# Patient Record
Sex: Female | Born: 1995 | Hispanic: No | Marital: Single | Smoking: Never smoker
Health system: Southern US, Community
[De-identification: ages and names within clinical notes are randomized; demographics above are authoritative.]

## PROBLEM LIST (undated history)

## (undated) DIAGNOSIS — F99 Mental disorder, not otherwise specified: Secondary | ICD-10-CM

## (undated) DIAGNOSIS — N61 Mastitis without abscess: Secondary | ICD-10-CM

## (undated) HISTORY — PX: NO PAST SURGERIES: SHX2092

## (undated) HISTORY — DX: Mastitis without abscess: N61.0

## (undated) HISTORY — DX: Mental disorder, not otherwise specified: F99

---
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
## 2017-04-03 ENCOUNTER — Encounter: Payer: Self-pay | Admitting: Family

## 2017-04-03 ENCOUNTER — Ambulatory Visit (INDEPENDENT_AMBULATORY_CARE_PROVIDER_SITE_OTHER): Payer: Managed Care, Other (non HMO) | Admitting: Family Medicine

## 2017-04-03 ENCOUNTER — Encounter: Payer: Self-pay | Admitting: Family Medicine

## 2017-04-03 VITALS — BP 110/70 | HR 92 | Temp 98.1°F | Ht 67.0 in | Wt 146.2 lb

## 2017-04-03 DIAGNOSIS — M545 Low back pain, unspecified: Secondary | ICD-10-CM

## 2017-04-03 MED ORDER — NAPROXEN 500 MG PO TABS: 500.0000 mg | ORAL_TABLET | Freq: Two times a day (BID) | ORAL | 0 refills | Status: DC

## 2017-04-03 MED ORDER — KETOROLAC TROMETHAMINE 60 MG/2ML IM SOLN
60.0000 mg | Freq: Once | INTRAMUSCULAR | Status: AC
  Administered 2017-04-03: 60 mg via INTRAMUSCULAR
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
## 2017-04-03 NOTE — Progress Notes (Signed)
Pre visit review using our clinic review tool, if applicable. No additional management support is needed unless otherwise documented below in the visit note. 

## 2017-04-03 NOTE — Patient Instructions (Signed)
Heat (pad or rice pillow in microwave) over affected area, 10-15 minutes twice daily.   OK to take Tylenol 1000 mg (2 extra strength tabs) or 975 mg (3 regular strength tabs) every 6 hours as needed.  EXERCISES  RANGE OF MOTION (ROM) AND STRETCHING EXERCISES - Low Back Prain Most people with lower back pain will find that their symptoms get worse with excessive bending forward (flexion) or arching at the lower back (extension). The exercises that will help resolve your symptoms will focus on the opposite motion.  If you have pain, numbness or tingling which travels down into your buttocks, leg or foot, the goal of the therapy is for these symptoms to move closer to your back and eventually resolve. Sometimes, these leg symptoms will get better, but your lower back pain may worsen. This is often an indication of progress in your rehabilitation. Be very alert to any changes in your symptoms and the activities in which you participated in the 24 hours prior to the change. Sharing this information with your caregiver will allow him or her to most efficiently treat your condition. These exercises may help you when beginning to rehabilitate your injury. Your symptoms may resolve with or without further involvement from your physician, physical therapist or athletic trainer. While completing these exercises, remember:   Restoring tissue flexibility helps normal motion to return to the joints. This allows healthier, less painful movement and activity.  An effective stretch should be held for at least 30 seconds.  A stretch should never be painful. You should only feel a gentle lengthening or release in the stretched tissue. FLEXION RANGE OF MOTION AND STRETCHING EXERCISES:  STRETCH - Flexion, Single Knee to Chest   Lie on a firm bed or floor with both legs extended in front of you.  Keeping one leg in contact with the floor, bring your opposite knee to your chest. Hold your leg in place by either  grabbing behind your thigh or at your knee.  Pull until you feel a gentle stretch in your low back. Hold 30 seconds.  Slowly release your grasp and repeat the exercise with the opposite side. Repeat 2 times. Complete this exercise 3 times per week.   STRETCH - Flexion, Double Knee to Chest  Lie on a firm bed or floor with both legs extended in front of you.  Keeping one leg in contact with the floor, bring your opposite knee to your chest.  Tense your stomach muscles to support your back and then lift your other knee to your chest. Hold your legs in place by either grabbing behind your thighs or at your knees.  Pull both knees toward your chest until you feel a gentle stretch in your low back. Hold 30 seconds.  Tense your stomach muscles and slowly return one leg at a time to the floor. Repeat 2 times. Complete this exercise 3 times per week.   STRETCH - Low Trunk Rotation  Lie on a firm bed or floor. Keeping your legs in front of you, bend your knees so they are both pointed toward the ceiling and your feet are flat on the floor.  Extend your arms out to the side. This will stabilize your upper body by keeping your shoulders in contact with the floor.  Gently and slowly drop both knees together to one side until you feel a gentle stretch in your low back. Hold for 30 seconds.  Tense your stomach muscles to support your lower back as you  bring your knees back to the starting position. Repeat the exercise to the other side. Repeat 2 times. Complete this exercise at least 3 times per week.   EXTENSION RANGE OF MOTION AND FLEXIBILITY EXERCISES:  STRETCH - Extension, Prone on Elbows   Lie on your stomach on the floor, a bed will be too soft. Place your palms about shoulder width apart and at the height of your head.  Place your elbows under your shoulders. If this is too painful, stack pillows under your chest.  Allow your body to relax so that your hips drop lower and make contact  more completely with the floor.  Hold this position for 30 seconds.  Slowly return to lying flat on the floor. Repeat 2 times. Complete this exercise 3 times per week.   RANGE OF MOTION - Extension, Prone Press Ups  Lie on your stomach on the floor, a bed will be too soft. Place your palms about shoulder width apart and at the height of your head.  Keeping your back as relaxed as possible, slowly straighten your elbows while keeping your hips on the floor. You may adjust the placement of your hands to maximize your comfort. As you gain motion, your hands will come more underneath your shoulders.  Hold this position 30 seconds.  Slowly return to lying flat on the floor. Repeat 2 times. Complete this exercise 3 times per week.   RANGE OF MOTION- Quadruped, Neutral Spine   Assume a hands and knees position on a firm surface. Keep your hands under your shoulders and your knees under your hips. You may place padding under your knees for comfort.  Drop your head and point your tailbone toward the ground below you. This will round out your lower back like an angry cat. Hold this position for 30 seconds.  Slowly lift your head and release your tail bone so that your back sags into a large arch, like an old horse.  Hold this position for 30 seconds.  Repeat this until you feel limber in your low back.  Now, find your "sweet spot." This will be the most comfortable position somewhere between the two previous positions. This is your neutral spine. Once you have found this position, tense your stomach muscles to support your low back.  Hold this position for 30 seconds. Repeat 2 times. Complete this exercise 3 times per week.   STRENGTHENING EXERCISES - Low Back Sprain These exercises may help you when beginning to rehabilitate your injury. These exercises should be done near your "sweet spot." This is the neutral, low-back arch, somewhere between fully rounded and fully arched, that is your  least painful position. When performed in this safe range of motion, these exercises can be used for people who have either a flexion or extension based injury. These exercises may resolve your symptoms with or without further involvement from your physician, physical therapist or athletic trainer. While completing these exercises, remember:   Muscles can gain both the endurance and the strength needed for everyday activities through controlled exercises.  Complete these exercises as instructed by your physician, physical therapist or athletic trainer. Increase the resistance and repetitions only as guided.  You may experience muscle soreness or fatigue, but the pain or discomfort you are trying to eliminate should never worsen during these exercises. If this pain does worsen, stop and make certain you are following the directions exactly. If the pain is still present after adjustments, discontinue the exercise until you can discuss  the trouble with your caregiver.  STRENGTHENING - Deep Abdominals, Pelvic Tilt   Lie on a firm bed or floor. Keeping your legs in front of you, bend your knees so they are both pointed toward the ceiling and your feet are flat on the floor.  Tense your lower abdominal muscles to press your low back into the floor. This motion will rotate your pelvis so that your tail bone is scooping upwards rather than pointing at your feet or into the floor. With a gentle tension and even breathing, hold this position for 3 seconds. Repeat 2 times. Complete this exercise 3 times per week.   STRENGTHENING - Abdominals, Crunches   Lie on a firm bed or floor. Keeping your legs in front of you, bend your knees so they are both pointed toward the ceiling and your feet are flat on the floor. Cross your arms over your chest.  Slightly tip your chin down without bending your neck.  Tense your abdominals and slowly lift your trunk high enough to just clear your shoulder blades. Lifting  higher can put excessive stress on the lower back and does not further strengthen your abdominal muscles.  Control your return to the starting position. Repeat 2 times. Complete this exercise 3 times per week.   STRENGTHENING - Quadruped, Opposite UE/LE Lift   Assume a hands and knees position on a firm surface. Keep your hands under your shoulders and your knees under your hips. You may place padding under your knees for comfort.  Find your neutral spine and gently tense your abdominal muscles so that you can maintain this position. Your shoulders and hips should form a rectangle that is parallel with the floor and is not twisted.  Keeping your trunk steady, lift your right hand no higher than your shoulder and then your left leg no higher than your hip. Make sure you are not holding your breath. Hold this position for 30 seconds.  Continuing to keep your abdominal muscles tense and your back steady, slowly return to your starting position. Repeat with the opposite arm and leg. Repeat 2 times. Complete this exercise 3 times per week.   STRENGTHENING - Abdominals and Quadriceps, Straight Leg Raise   Lie on a firm bed or floor with both legs extended in front of you.  Keeping one leg in contact with the floor, bend the other knee so that your foot can rest flat on the floor.  Find your neutral spine, and tense your abdominal muscles to maintain your spinal position throughout the exercise.  Slowly lift your straight leg off the floor about 6 inches for a count of 3, making sure to not hold your breath.  Still keeping your neutral spine, slowly lower your leg all the way to the floor. Repeat this exercise with each leg 2 times. Complete this exercise 3 times per week.  POSTURE AND BODY MECHANICS CONSIDERATIONS - Low Back Sprain Keeping correct posture when sitting, standing or completing your activities will reduce the stress put on different body tissues, allowing injured tissues a  chance to heal and limiting painful experiences. The following are general guidelines for improved posture.  While reading these guidelines, remember:  The exercises prescribed by your provider will help you have the flexibility and strength to maintain correct postures.  The correct posture provides the best environment for your joints to work. All of your joints have less wear and tear when properly supported by a spine with good posture. This means you   will experience a healthier, less painful body.  Correct posture must be practiced with all of your activities, especially prolonged sitting and standing. Correct posture is as important when doing repetitive low-stress activities (typing) as it is when doing a single heavy-load activity (lifting).  RESTING POSITIONS Consider which positions are most painful for you when choosing a resting position. If you have pain with flexion-based activities (sitting, bending, stooping, squatting), choose a position that allows you to rest in a less flexed posture. You would want to avoid curling into a fetal position on your side. If your pain worsens with extension-based activities (prolonged standing, working overhead), avoid resting in an extended position such as sleeping on your stomach. Most people will find more comfort when they rest with their spine in a more neutral position, neither too rounded nor too arched. Lying on a non-sagging bed on your side with a pillow between your knees, or on your back with a pillow under your knees will often provide some relief. Keep in mind, being in any one position for a prolonged period of time, no matter how correct your posture, can still lead to stiffness.  PROPER SITTING POSTURE In order to minimize stress and discomfort on your spine, you must sit with correct posture. Sitting with good posture should be effortless for a healthy body. Returning to good posture is a gradual process. Many people can work toward this  most comfortably by using various supports until they have the flexibility and strength to maintain this posture on their own. When sitting with proper posture, your ears will fall over your shoulders and your shoulders will fall over your hips. You should use the back of the chair to support your upper back. Your lower back will be in a neutral position, just slightly arched. You may place a small pillow or folded towel at the base of your lower back for  support.  When working at a desk, create an environment that supports good, upright posture. Without extra support, muscles tire, which leads to excessive strain on joints and other tissues. Keep these recommendations in mind:  CHAIR:  A chair should be able to slide under your desk when your back makes contact with the back of the chair. This allows you to work closely.  The chair's height should allow your eyes to be level with the upper part of your monitor and your hands to be slightly lower than your elbows.  BODY POSITION  Your feet should make contact with the floor. If this is not possible, use a foot rest.  Keep your ears over your shoulders. This will reduce stress on your neck and low back.  INCORRECT SITTING POSTURES  If you are feeling tired and unable to assume a healthy sitting posture, do not slouch or slump. This puts excessive strain on your back tissues, causing more damage and pain. Healthier options include:  Using more support, like a lumbar pillow.  Switching tasks to something that requires you to be upright or walking.  Talking a brief walk.  Lying down to rest in a neutral-spine position.  PROLONGED STANDING WHILE SLIGHTLY LEANING FORWARD  When completing a task that requires you to lean forward while standing in one place for a long time, place either foot up on a stationary 2-4 inch high object to help maintain the best posture. When both feet are on the ground, the lower back tends to lose its slight inward  curve. If this curve flattens (or becomes too   large), then the back and your other joints will experience too much stress, tire more quickly, and can cause pain.  CORRECT STANDING POSTURES Proper standing posture should be assumed with all daily activities, even if they only take a few moments, like when brushing your teeth. As in sitting, your ears should fall over your shoulders and your shoulders should fall over your hips. You should keep a slight tension in your abdominal muscles to brace your spine. Your tailbone should point down to the ground, not behind your body, resulting in an over-extended swayback posture.   INCORRECT STANDING POSTURES  Common incorrect standing postures include a forward head, locked knees and/or an excessive swayback. WALKING Walk with an upright posture. Your ears, shoulders and hips should all line-up.  PROLONGED ACTIVITY IN A FLEXED POSITION When completing a task that requires you to bend forward at your waist or lean over a low surface, try to find a way to stabilize 3 out of 4 of your limbs. You can place a hand or elbow on your thigh or rest a knee on the surface you are reaching across. This will provide you more stability, so that your muscles do not tire as quickly. By keeping your knees relaxed, or slightly bent, you will also reduce stress across your lower back. CORRECT LIFTING TECHNIQUES  DO :  Assume a wide stance. This will provide you more stability and the opportunity to get as close as possible to the object which you are lifting.  Tense your abdominals to brace your spine. Bend at the knees and hips. Keeping your back locked in a neutral-spine position, lift using your leg muscles. Lift with your legs, keeping your back straight.  Test the weight of unknown objects before attempting to lift them.  Try to keep your elbows locked down at your sides in order get the best strength from your shoulders when carrying an object.     Always ask  for help when lifting heavy or awkward objects. INCORRECT LIFTING TECHNIQUES DO NOT:   Lock your knees when lifting, even if it is a small object.  Bend and twist. Pivot at your feet or move your feet when needing to change directions.  Assume that you can safely pick up even a paperclip without proper posture.   

## 2017-07-25 ENCOUNTER — Other Ambulatory Visit: Payer: Self-pay | Admitting: Family

## 2017-07-27 NOTE — Telephone Encounter (Signed)
Melissa-- please advise blisovi refill request?  Pt seen for acute visit by Dr Carmelia RollerWendling in March but hasn't seen you since 07/24/16.

## 2017-07-28 ENCOUNTER — Telehealth: Payer: Self-pay | Admitting: Family

## 2017-07-28 NOTE — Telephone Encounter (Signed)
Katie Butler -- please call pt to schedule appt. Thanks!

## 2017-07-28 NOTE — Telephone Encounter (Signed)
Called pt and LVM informing pt that her refill was sent. However, she is due for a CPE. Advised for pt to call back and schedule a cpe at her convenience.

## 2017-07-28 NOTE — Telephone Encounter (Signed)
Refill sent, needs OV for CPX please.

## 2017-08-19 ENCOUNTER — Telehealth: Payer: Self-pay | Admitting: *Deleted

## 2017-08-19 NOTE — Telephone Encounter (Signed)
Received request for Medical Records from [employer]; forwarded to [place] for email/scan/SLS 07/17

## 2018-02-19 ENCOUNTER — Ambulatory Visit: Payer: 59 | Admitting: Psychology

## 2018-03-02 ENCOUNTER — Ambulatory Visit (INDEPENDENT_AMBULATORY_CARE_PROVIDER_SITE_OTHER): Payer: 59 | Admitting: Psychology

## 2018-03-02 DIAGNOSIS — F411 Generalized anxiety disorder: Secondary | ICD-10-CM | POA: Diagnosis not present

## 2018-03-02 DIAGNOSIS — F4310 Post-traumatic stress disorder, unspecified: Secondary | ICD-10-CM | POA: Diagnosis not present

## 2018-03-03 ENCOUNTER — Encounter: Payer: Self-pay | Admitting: Family

## 2018-03-03 ENCOUNTER — Ambulatory Visit (INDEPENDENT_AMBULATORY_CARE_PROVIDER_SITE_OTHER): Payer: Managed Care, Other (non HMO) | Admitting: Family

## 2018-03-03 VITALS — BP 135/78 | HR 104 | Temp 98.2°F | Resp 16 | Ht 67.0 in | Wt 127.0 lb

## 2018-03-03 DIAGNOSIS — Z30011 Encounter for initial prescription of contraceptive pills: Secondary | ICD-10-CM | POA: Diagnosis not present

## 2018-03-03 DIAGNOSIS — R634 Abnormal weight loss: Secondary | ICD-10-CM | POA: Diagnosis not present

## 2018-03-03 DIAGNOSIS — F418 Other specified anxiety disorders: Secondary | ICD-10-CM

## 2018-03-03 LAB — TSH: TSH: 2.46 u[IU]/mL (ref 0.35–5.50)

## 2018-03-03 MED ORDER — SERTRALINE HCL 50 MG PO TABS: ORAL_TABLET | ORAL | 0 refills | Status: DC

## 2018-03-03 NOTE — Patient Instructions (Signed)
Please complete lab work prior to leaving. Start zoloft 1/2 tab once daily for 1 week, then increase to a full tab once daily on week two.
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
## 2018-03-16 ENCOUNTER — Ambulatory Visit: Payer: 59 | Admitting: Psychology

## 2018-03-29 ENCOUNTER — Other Ambulatory Visit: Payer: Self-pay | Admitting: Family

## 2018-03-30 ENCOUNTER — Ambulatory Visit (INDEPENDENT_AMBULATORY_CARE_PROVIDER_SITE_OTHER): Payer: Managed Care, Other (non HMO) | Admitting: Family

## 2018-03-30 ENCOUNTER — Ambulatory Visit: Payer: Self-pay | Admitting: Family

## 2018-03-30 ENCOUNTER — Encounter: Payer: Self-pay | Admitting: Family

## 2018-03-30 ENCOUNTER — Ambulatory Visit (INDEPENDENT_AMBULATORY_CARE_PROVIDER_SITE_OTHER): Payer: Managed Care, Other (non HMO) | Admitting: Psychology

## 2018-03-30 VITALS — BP 116/71 | HR 65 | Temp 98.6°F | Resp 16 | Ht 67.0 in | Wt 132.6 lb

## 2018-03-30 DIAGNOSIS — F411 Generalized anxiety disorder: Secondary | ICD-10-CM | POA: Diagnosis not present

## 2018-03-30 DIAGNOSIS — F418 Other specified anxiety disorders: Secondary | ICD-10-CM | POA: Diagnosis not present

## 2018-03-30 NOTE — Patient Instructions (Signed)
Please continue zoloft and your appointments with Terrie.

## 2018-03-30 NOTE — Progress Notes (Signed)
Subjective:    Patient ID: Katie Butler, female    DOB: 05/19/1995, 23 y.o.   MRN: [id number]  HPI  Patient is a 23 yr old female who presents today for follow up of her depression/anxiety. Last visit she noted that she was sleeping a lot, having anxiety and racing thoughts.  She reported poor appetite, weight loss.  Inability to tolerate unexpected changes. Also reported that she had trouble turning off her brain.   Wt Readings from Last 3 Encounters:  03/30/18 132 lb 9.6 oz (60.1 kg)  03/03/18 127 lb (57.6 kg)  04/03/17 146 lb 4 oz (66.3 kg) (78 %, Z= 0.77)*   * Growth percentiles are based on CDC (Girls, 2-20 Years) data.   She reports that since starting the medication she feels less anxious. Still thinks a lot but manage her thoughts better.  She reports that she had one episode which was 2 or 3 days after she started the half dose of Zoloft when she got really upset and began hitting herself.  Her mom calmed her down.  She seen realized that she did not want to hurt her self and that she needed to better manage her emotions.  Overall she is feeling a lot better.  Review of Systems    see HPI  No past medical history on file.   Social History   Socioeconomic History  . Marital status: Single    Spouse name: Not on file  . Number of children: Not on file  . Years of education: Not on file  . Highest education level: Not on file  Occupational History  . Not on file  Social Needs  . Financial resource strain: Not on file  . Food insecurity:    Worry: Not on file    Inability: Not on file  . Transportation needs:    Medical: Not on file    Non-medical: Not on file  Tobacco Use  . Smoking status: Never Smoker  . Smokeless tobacco: Never Used  Substance and Sexual Activity  . Alcohol use: No    Alcohol/week: 0.0 standard drinks  . Drug use: Not on file  . Sexual activity: Yes    Partners: Female  Lifestyle  . Physical activity:    Days per week: Not on file   Minutes per session: Not on file  . Stress: Not on file  Relationships  . Social connections:    Talks on phone: Not on file    Gets together: Not on file    Attends religious service: Not on file    Active member of club or organization: Not on file    Attends meetings of clubs or organizations: Not on file    Relationship status: Not on file  . Intimate partner violence:    Fear of current or ex partner: Not on file    Emotionally abused: Not on file    Physically abused: Not on file    Forced sexual activity: Not on file  Other Topics Concern  . Not on file  Social History Narrative  . Not on file    Past Surgical History:  Procedure Laterality Date  . NO PAST SURGERIES      Family History  Problem Relation Age of Onset  . Cancer Other        breast  . Arthritis Maternal Grandmother   . Fibromyalgia Maternal Grandmother   . Scoliosis Maternal Grandmother   . Arthritis Maternal Grandfather   . Hypertension  Maternal Grandfather   . Cancer Maternal Grandfather        lung, prostate  . Hyperlipidemia Maternal Grandfather   . Arthritis Paternal Grandmother   . Diabetes Paternal Grandmother   . Arthritis Paternal Grandfather   . Other Father        hypoglycemia    No Known Allergies  Current Outpatient Medications on File Prior to Visit  Medication Sig Dispense Refill  . Norethin Ace-Eth Estrad-FE (BLISOVI 24 FE PO) Take by mouth.    . sertraline (ZOLOFT) 50 MG tablet 1/2 tab by mouth once daily for 1 week, then increase to a full tab once daily. 30 tablet 0   No current facility-administered medications on file prior to visit.     BP 116/71 (BP Location: Left Arm, Cuff Size: Normal)   Pulse 65   Temp 98.6 F (37 C) (Oral)   Resp 16   Ht 5\' 7"  (1.702 m)   Wt 132 lb 9.6 oz (60.1 kg)   SpO2 100%   BMI 20.77 kg/m    Objective:   Physical Exam Constitutional:      Appearance: Normal appearance.  Neurological:     General: No focal deficit present.      Mental Status: She is alert and oriented to person, place, and time.  Psychiatric:        Mood and Affect: Mood normal.        Behavior: Behavior normal.        Thought Content: Thought content normal.        Judgment: Judgment normal.           Assessment & Plan:   Depression/anxiety-this is improved.  She has an appointment later today to meet with the counselor.  I think that this is very important for her to try to work through some of the trauma and other issues that have been difficult for her.  She has gained a few pounds which I think is good.  We discussed that she should try to keep her weight under 140 and if she starts to see her weight rising >140 she should let me know.  A total of *  minutes were spent face-to-face with the patient during this encounter and over half of that time was spent on counseling and coordination of care. The patient was counseled on anxiety/depression.

## 2018-04-14 ENCOUNTER — Ambulatory Visit (INDEPENDENT_AMBULATORY_CARE_PROVIDER_SITE_OTHER): Payer: Managed Care, Other (non HMO) | Admitting: Obstetrics and Gynecology

## 2018-04-14 ENCOUNTER — Encounter: Payer: Self-pay | Admitting: Obstetrics and Gynecology

## 2018-04-14 ENCOUNTER — Other Ambulatory Visit: Payer: Self-pay

## 2018-04-14 VITALS — BP 117/69 | HR 72 | Ht 67.0 in | Wt 132.0 lb

## 2018-04-14 DIAGNOSIS — Z3009 Encounter for other general counseling and advice on contraception: Secondary | ICD-10-CM

## 2018-04-14 DIAGNOSIS — Z01419 Encounter for gynecological examination (general) (routine) without abnormal findings: Secondary | ICD-10-CM

## 2018-04-14 DIAGNOSIS — N939 Abnormal uterine and vaginal bleeding, unspecified: Secondary | ICD-10-CM

## 2018-04-14 DIAGNOSIS — Z113 Encounter for screening for infections with a predominantly sexual mode of transmission: Secondary | ICD-10-CM | POA: Diagnosis not present

## 2018-04-14 DIAGNOSIS — Z23 Encounter for immunization: Secondary | ICD-10-CM

## 2018-04-14 NOTE — Progress Notes (Signed)
Pt states that she has been having irregular periods x 4 months.

## 2018-04-14 NOTE — Progress Notes (Signed)
GYNECOLOGY ANNUAL PREVENTATIVE CARE ENCOUNTER NOTE  Subjective:   Katie Butler is a 23 y.o. G0P0000 female here for a annual gynecologic exam. Current complaints: irregular periods. Denies discharge, pelvic pain, problems with intercourse or other gynecologic concerns.   Has been on OCPs since 2018. Was having regular periods with it. Then 4-6 months ago, started having very irregular periods. Periods are 2-11 days, occurring every 2-4 weeks. They are very random and not predictable at all. She is not missing pills or doubling up. Nothing else has changed. Started meds for anxiety about 4 weeks ago. Periods are lighter than before she started the OCPs. Started OCPs for contraception.   Desires STI screen.    Gynecologic History Patient's last menstrual period was 03/26/2018. Contraception: OCP (estrogen/progesterone), sexually active with women Last Pap: n/a Last mammogram: n/a Gardisil: has not received  Obstetric History OB History  Gravida Para Term Preterm AB Living  0 0 0 0 0 0  SAB TAB Ectopic Multiple Live Births  0 0 0 0 0   History reviewed. No pertinent past medical history.  Past Surgical History:  Procedure Laterality Date  . NO PAST SURGERIES      Current Outpatient Medications on File Prior to Visit  Medication Sig Dispense Refill  . Norethin Ace-Eth Estrad-FE (BLISOVI 24 FE PO) Take by mouth.    . sertraline (ZOLOFT) 50 MG tablet Take 1 tablet (50 mg total) by mouth daily. 90 tablet 0   No current facility-administered medications on file prior to visit.     No Known Allergies  Social History   Socioeconomic History  . Marital status: Single    Spouse name: Not on file  . Number of children: Not on file  . Years of education: Not on file  . Highest education level: Not on file  Occupational History  . Not on file  Social Needs  . Financial resource strain: Not on file  . Food insecurity:    Worry: Not on file    Inability: Not on file  .  Transportation needs:    Medical: Not on file    Non-medical: Not on file  Tobacco Use  . Smoking status: Never Smoker  Substance and Sexual Activity  . Alcohol use: No    Alcohol/week: 0.0 standard drinks  . Drug use: Never  . Sexual activity: Not Currently    Partners: Female    Birth control/protection: Pill  Lifestyle  . Physical activity:    Days per week: Not on file    Minutes per session: Not on file  . Stress: Not on file  Relationships  . Social connections:    Talks on phone: Not on file    Gets together: Not on file    Attends religious service: Not on file    Active member of club or organization: Not on file    Attends meetings of clubs or organizations: Not on file    Relationship status: Not on file  . Intimate partner violence:    Fear of current or ex partner: Not on file    Emotionally abused: Not on file    Physically abused: Not on file    Forced sexual activity: Not on file  Other Topics Concern  . Not on file  Social History Narrative  . Not on file    Family History  Problem Relation Age of Onset  . Cancer Other        breast  . Arthritis Maternal  Grandmother   . Fibromyalgia Maternal Grandmother   . Scoliosis Maternal Grandmother   . Arthritis Maternal Grandfather   . Hypertension Maternal Grandfather   . Cancer Maternal Grandfather        lung, prostate  . Hyperlipidemia Maternal Grandfather   . Arthritis Paternal Grandmother   . Diabetes Paternal Grandmother   . Arthritis Paternal Grandfather   . Other Father        hypoglycemia    The following portions of the patient's history were reviewed and updated as appropriate: allergies, current medications, past family history, past medical history, past social history, past surgical history and problem list.  Review of Systems Pertinent items are noted in HPI.   Objective:  BP 117/69   Pulse 72   Ht 5\' 7"  (1.702 m)   Wt 132 lb (59.9 kg)   LMP 03/26/2018   BMI 20.67 kg/m   CONSTITUTIONAL: Well-developed, well-nourished female in no acute distress.  HENT:  Normocephalic, atraumatic, External right and left ear normal. Oropharynx is clear and moist EYES: Conjunctivae and EOM are normal. Pupils are equal, round, and reactive to light. No scleral icterus.  NECK: Normal range of motion, supple, no masses.  Normal thyroid.  SKIN: Skin is warm and dry. No rash noted. Not diaphoretic. No erythema. No pallor. NEUROLOGIC: Alert and oriented to person, place, and time. Normal reflexes, muscle tone coordination. No cranial nerve deficit noted. PSYCHIATRIC: Normal mood and affect. Normal behavior. Normal judgment and thought content. CARDIOVASCULAR: Normal heart rate noted, regular rhythm RESPIRATORY: Clear to auscultation bilaterally. Effort and breath sounds normal, no problems with respiration noted. BREASTS: Symmetric in size. No masses, skin changes, nipple drainage, or lymphadenopathy. ABDOMEN: Soft, normal bowel sounds, no distention noted.  No tenderness, rebound or guarding.  PELVIC: Normal appearing external genitalia; normal appearing vaginal mucosa and cervix.  No abnormal discharge noted. Normal uterine size, no other palpable masses, no uterine or adnexal tenderness. MUSCULOSKELETAL: Normal range of motion. No tenderness.  No cyanosis, clubbing, or edema.  2+ distal pulses.   Assessment and Plan:   1. Well woman exam Benign exam  2. Encounter for counseling regarding contraception States does not need contraception for now as she is not having intercourse with men, but would like to be on something so that if she decides to have intercourse with men, will not become pregnant  3. Routine screening for STI (sexually transmitted infection) - HIV Antibody (routine testing w rflx) - Hepatitis B surface antigen - Hepatitis C Antibody - RPR - Cervicovaginal ancillary only( [place])  4. Need for HPV vaccination - HPV 9-valent vaccine,Recombinat  5.  Abnormal uterine bleeding (AUB) - For last 4-6 months on OCPs - reviewed that if she does not need OCPs for contraception as she is currently only sexually active with women, she has several options including to stop OCPs, switch OCPs, switch to another method of contraception. Reviewed risks/benefits of each, including of LARCs. She is interested in IUD but concerned about pain with insertion, I reviewed expected course. She desires to stop OCPs, will have further discussion pending how she does at next visit as she is coming in two months for gardisil shot. Patient in agreement with plan.   Will follow up results of STI screen and manage accordingly. Encouraged improvement in diet and exercise.  Mammogram n/a Referral for colonoscopy n/a Gardisil TODAY!!  Routine preventative health maintenance measures emphasized. Please refer to After Visit Summary for other counseling recommendations.    K. Meryl  Earlene Plater, M.D. [facility]ending [facility])

## 2018-04-15 LAB — HEPATITIS B SURFACE ANTIGEN: HEP B S AG: NEGATIVE

## 2018-04-15 LAB — HEPATITIS C ANTIBODY: Hep C Virus Ab: <0.1 s/co ratio (ref 0.0–0.9)

## 2018-04-15 LAB — HIV ANTIBODY (ROUTINE TESTING W REFLEX): HIV SCREEN 4TH GENERATION: NONREACTIVE

## 2018-04-15 LAB — RPR: RPR Ser Ql: NONREACTIVE

## 2018-04-16 LAB — CERVICOVAGINAL ANCILLARY ONLY
Chlamydia: NEGATIVE
Neisseria Gonorrhea: NEGATIVE
Trichomonas: NEGATIVE

## 2018-04-27 ENCOUNTER — Ambulatory Visit: Payer: 59 | Admitting: Psychology

## 2018-04-28 ENCOUNTER — Ambulatory Visit (INDEPENDENT_AMBULATORY_CARE_PROVIDER_SITE_OTHER): Payer: 59 | Admitting: Psychology

## 2018-04-28 DIAGNOSIS — F411 Generalized anxiety disorder: Secondary | ICD-10-CM

## 2018-05-11 ENCOUNTER — Ambulatory Visit (INDEPENDENT_AMBULATORY_CARE_PROVIDER_SITE_OTHER): Payer: 59 | Admitting: Psychology

## 2018-05-11 DIAGNOSIS — F411 Generalized anxiety disorder: Secondary | ICD-10-CM | POA: Diagnosis not present

## 2018-05-25 ENCOUNTER — Ambulatory Visit (INDEPENDENT_AMBULATORY_CARE_PROVIDER_SITE_OTHER): Payer: 59 | Admitting: Psychology

## 2018-05-25 DIAGNOSIS — F411 Generalized anxiety disorder: Secondary | ICD-10-CM

## 2018-06-09 ENCOUNTER — Ambulatory Visit (INDEPENDENT_AMBULATORY_CARE_PROVIDER_SITE_OTHER): Payer: 59 | Admitting: Psychology

## 2018-06-09 DIAGNOSIS — F411 Generalized anxiety disorder: Secondary | ICD-10-CM

## 2018-06-14 ENCOUNTER — Ambulatory Visit: Payer: Self-pay | Admitting: Obstetrics & Gynecology

## 2018-06-23 ENCOUNTER — Ambulatory Visit (INDEPENDENT_AMBULATORY_CARE_PROVIDER_SITE_OTHER): Payer: 59 | Admitting: Psychology

## 2018-06-23 DIAGNOSIS — F411 Generalized anxiety disorder: Secondary | ICD-10-CM | POA: Diagnosis not present

## 2018-06-26 ENCOUNTER — Other Ambulatory Visit: Payer: Self-pay | Admitting: Family

## 2018-06-29 ENCOUNTER — Other Ambulatory Visit: Payer: Self-pay

## 2018-06-29 ENCOUNTER — Telehealth (INDEPENDENT_AMBULATORY_CARE_PROVIDER_SITE_OTHER): Payer: Managed Care, Other (non HMO) | Admitting: Family

## 2018-06-29 DIAGNOSIS — F418 Other specified anxiety disorders: Secondary | ICD-10-CM

## 2018-06-29 DIAGNOSIS — N926 Irregular menstruation, unspecified: Secondary | ICD-10-CM | POA: Diagnosis not present

## 2018-06-29 NOTE — Progress Notes (Signed)
Virtual Visit via Video Note  I connected with@ on 06/29/18 at  9:40 AM EDT by a video enabled telemedicine application and verified that I am speaking with the correct person using two identifiers. This visit type was conducted due to national recommendations for restrictions regarding the COVID-19 Pandemic (e.g. social distancing).  This format is felt to be most appropriate for this patient at this time.   I discussed the limitations of evaluation and management by telemedicine and the availability of in person appointments. The patient expressed understanding and agreed to proceed.  Only the patient and myself were on today's video visit. The patient was at home and I was in my office at the time of today's visit.   History of Present Illness:  Patient is a 23 yr old female who presents today for follow up of her depression and anxiety. Reports that she is seeing Engineer, structural (counselor) every 2 weeks.  Reports that she is sleeping well.  She reports that she has not checked her weight recently. Feels that her appetite has improved.  She denies any panic attacks. She continues to work at [employer] and [employer].   She does report some irregular menses while on OCP. She reports "sometimes period would be 3 days and sometimes 11 days." Reports that she has been off of OCP for several months and has an appointment tomorrow to meet with GYN to further discuss.   Observations/Objective:   Gen: Awake, alert, no acute distress Resp: Breathing is even and non-labored Psych: calm/pleasant demeanor Neuro: Alert and Oriented x 3, + facial symmetry, speech is clear.  Assessment and Plan:  Depression/anxiety- stable/improved with counseling and medicine.  Encouraged pt to continue same.   Irregular menses- new. Agree with GYN follow up to discuss. She is not needing OCP for contraception at this time.    Follow Up Instructions:    I discussed the assessment and treatment plan with the  patient. The patient was provided an opportunity to ask questions and all were answered. The patient agreed with the plan and demonstrated an understanding of the instructions.   The patient was advised to call back or seek an in-person evaluation if the symptoms worsen or if the condition fails to improve as anticipated.    Lemont Fillers, NP

## 2018-06-30 ENCOUNTER — Other Ambulatory Visit: Payer: Self-pay

## 2018-06-30 ENCOUNTER — Ambulatory Visit (INDEPENDENT_AMBULATORY_CARE_PROVIDER_SITE_OTHER): Payer: Managed Care, Other (non HMO) | Admitting: Obstetrics and Gynecology

## 2018-06-30 ENCOUNTER — Encounter: Payer: Self-pay | Admitting: Obstetrics and Gynecology

## 2018-06-30 VITALS — BP 113/64 | HR 77 | Resp 16 | Ht 67.0 in | Wt 130.0 lb

## 2018-06-30 DIAGNOSIS — Z23 Encounter for immunization: Secondary | ICD-10-CM | POA: Diagnosis not present

## 2018-06-30 DIAGNOSIS — Z3009 Encounter for other general counseling and advice on contraception: Secondary | ICD-10-CM

## 2018-06-30 MED ORDER — LO LOESTRIN FE 1 MG-10 MCG / 10 MCG PO TABS: 1.0000 | ORAL_TABLET | Freq: Every day | ORAL | 1 refills | Status: DC

## 2018-06-30 NOTE — Patient Instructions (Signed)
Use the following websites (and others) to help learn more about your contraception options and find the method that is right for you!  - The Centers for Disease Control (CDC) website: TransferLive.sehttps://www.cdc.gov/reproductivehealth/contraception/index.htm  - [facility] website: https://www.plannedparenthood.org/learn/birth-control  - Bedsider.org: https://www.bedsider.org/methods     Levonorgestrel intrauterine device (IUD) What is this medicine? LEVONORGESTREL IUD (LEE voe nor jes trel) is a contraceptive (birth control) device. The device is placed inside the uterus by a healthcare professional. It is used to prevent pregnancy. This device can also be used to treat heavy bleeding that occurs during your period. This medicine may be used for other purposes; ask your health care provider or pharmacist if you have questions. COMMON BRAND NAME(S): Cameron AliKyleena, LILETTA, Mirena, Skyla What should I tell my health care provider before I take this medicine? They need to know if you have any of these conditions: -abnormal Pap smear -cancer of the breast, uterus, or cervix -diabetes -endometritis -genital or pelvic infection now or in the past -have more than one sexual partner or your partner has more than one partner -heart disease -history of an ectopic or tubal pregnancy -immune system problems -IUD in place -liver disease or tumor -problems with blood clots or take blood-thinners -seizures -use intravenous drugs -uterus of unusual shape -vaginal bleeding that has not been explained -an unusual or allergic reaction to levonorgestrel, other hormones, silicone, or polyethylene, medicines, foods, dyes, or preservatives -pregnant or trying to get pregnant -breast-feeding How should I use this medicine? This device is placed inside the uterus by a health care professional. Talk to your pediatrician regarding the use of this medicine in children. Special care may be needed. Overdosage: If  you think you have taken too much of this medicine contact a poison control center or emergency room at once. NOTE: This medicine is only for you. Do not share this medicine with others. What if I miss a dose? This does not apply. Depending on the brand of device you have inserted, the device will need to be replaced every 3 to 5 years if you wish to continue using this type of birth control. What may interact with this medicine? Do not take this medicine with any of the following medications: -amprenavir -bosentan -fosamprenavir This medicine may also interact with the following medications: -aprepitant -armodafinil -barbiturate medicines for inducing sleep or treating seizures -bexarotene -boceprevir -griseofulvin -medicines to treat seizures like carbamazepine, ethotoin, felbamate, oxcarbazepine, phenytoin, topiramate -modafinil -pioglitazone -rifabutin -rifampin -rifapentine -some medicines to treat HIV infection like atazanavir, efavirenz, indinavir, lopinavir, nelfinavir, tipranavir, ritonavir -St. John's wort -warfarin This list may not describe all possible interactions. Give your health care provider a list of all the medicines, herbs, non-prescription drugs, or dietary supplements you use. Also tell them if you smoke, drink alcohol, or use illegal drugs. Some items may interact with your medicine. What should I watch for while using this medicine? Visit your doctor or health care professional for regular check ups. See your doctor if you or your partner has sexual contact with others, becomes HIV positive, or gets a sexual transmitted disease. This product does not protect you against HIV infection (AIDS) or other sexually transmitted diseases. You can check the placement of the IUD yourself by reaching up to the top of your vagina with clean fingers to feel the threads. Do not pull on the threads. It is a good habit to check placement after each menstrual period. Call your  doctor right away if you feel more of the IUD than  just the threads or if you cannot feel the threads at all. The IUD may come out by itself. You may become pregnant if the device comes out. If you notice that the IUD has come out use a backup birth control method like condoms and call your health care provider. Using tampons will not change the position of the IUD and are okay to use during your period. This IUD can be safely scanned with magnetic resonance imaging (MRI) only under specific conditions. Before you have an MRI, tell your healthcare provider that you have an IUD in place, and which type of IUD you have in place. What side effects may I notice from receiving this medicine? Side effects that you should report to your doctor or health care professional as soon as possible: -allergic reactions like skin rash, itching or hives, swelling of the face, lips, or tongue -fever, flu-like symptoms -genital sores -high blood pressure -no menstrual period for 6 weeks during use -pain, swelling, warmth in the leg -pelvic pain or tenderness -severe or sudden headache -signs of pregnancy -stomach cramping -sudden shortness of breath -trouble with balance, talking, or walking -unusual vaginal bleeding, discharge -yellowing of the eyes or skin Side effects that usually do not require medical attention (report to your doctor or health care professional if they continue or are bothersome): -acne -breast pain -change in sex drive or performance -changes in weight -cramping, dizziness, or faintness while the device is being inserted -headache -irregular menstrual bleeding within first 3 to 6 months of use -nausea This list may not describe all possible side effects. Call your doctor for medical advice about side effects. You may report side effects to FDA at 1-800-FDA-1088. Where should I keep my medicine? This does not apply. NOTE: This sheet is a summary. It may not cover all possible  information. If you have questions about this medicine, talk to your doctor, pharmacist, or health care provider.  2019 Elsevier/Gold Standard (2015-11-02 14:14:56)

## 2018-06-30 NOTE — Progress Notes (Signed)
   GYNECOLOGY OFFICE FOLLOW UP NOTE  History:  23 y.o. G0P0000 here today for follow up for AUB. Was on OCPs and had very heavy, painful periods,  had abnormal bleeding. Last visit, after reviewing options, pt opted to stop OCPs altogether and see how her periods went. Last period was okay, bled 5-6 days and was not heavy. Period before that, was extremely heavy.  She wants to start something but is unsure what, worried about her ability to take pills regularly, does not want to have kids in next 5-6 years.  Past Medical History:  Diagnosis Date  . Mastitis     Past Surgical History:  Procedure Laterality Date  . NO PAST SURGERIES       Current Outpatient Medications:  .  sertraline (ZOLOFT) 50 MG tablet, TAKE ONE TABLET BY MOUTH DAILY, Disp: 90 tablet, Rfl: 1 .  LO LOESTRIN FE 1 MG-10 MCG / 10 MCG tablet, Take 1 tablet by mouth daily., Disp: 3 Package, Rfl: 1 .  Norethin Ace-Eth Estrad-FE (BLISOVI 24 FE PO), Take by mouth., Disp: , Rfl:   The following portions of the patient's history were reviewed and updated as appropriate: allergies, current medications, past family history, past medical history, past social history, past surgical history and problem list.   Review of Systems:  Pertinent items noted in HPI and remainder of comprehensive ROS otherwise negative.   Objective:  Physical Exam BP 113/64   Pulse 77   Resp 16   Ht 5\' 7"  (1.702 m)   Wt 130 lb (59 kg)   LMP 06/12/2018   BMI 20.36 kg/m  CONSTITUTIONAL: Well-developed, well-nourished female in no acute distress.  HENT:  Normocephalic, atraumatic. External right and left ear normal. Oropharynx is clear and moist EYES: Conjunctivae and EOM are normal. Pupils are equal, round, and reactive to light. No scleral icterus.  NECK: Normal range of motion, supple, no masses SKIN: Skin is warm and dry. No rash noted. Not diaphoretic. No erythema. No pallor. NEUROLOGIC: Alert and oriented to person, place, and time. Normal  reflexes, muscle tone coordination. No cranial nerve deficit noted. PSYCHIATRIC: Normal mood and affect. Normal behavior. Normal judgment and thought content. CARDIOVASCULAR: Normal heart rate noted RESPIRATORY: Effort normal, no problems with respiration noted ABDOMEN:  no distention noted.   PELVIC: deferred MUSCULOSKELETAL: Normal range of motion. No edema noted.  Labs and Imaging No results found.  Assessment & Plan:   1. Encounter for other general counseling or advice on contraception - Reviewed risks/benefits of OCPs versus IUD, patient very interested in IUD but wants to do some more reading first - prescribed lo loestrin today to improve AUB for now - she will read about IUD and call to make appointment for insertion  2. Need for HPV vaccine 2nd HPV dose given today   Routine preventative health maintenance measures emphasized. Please refer to After Visit Summary for other counseling recommendations.   Return in about 3 months (around 09/30/2018).  Total face-to-face time with patient: 15 minutes. Over 50% of encounter was spent on counseling and coordination of care.  Baldemar Lenis, M.D. Attending [facility] [employer] [facility])

## 2018-07-07 ENCOUNTER — Ambulatory Visit (INDEPENDENT_AMBULATORY_CARE_PROVIDER_SITE_OTHER): Payer: 59 | Admitting: Psychology

## 2018-07-07 DIAGNOSIS — F411 Generalized anxiety disorder: Secondary | ICD-10-CM | POA: Diagnosis not present

## 2018-07-26 ENCOUNTER — Ambulatory Visit (INDEPENDENT_AMBULATORY_CARE_PROVIDER_SITE_OTHER): Payer: 59 | Admitting: Psychology

## 2018-07-26 DIAGNOSIS — F411 Generalized anxiety disorder: Secondary | ICD-10-CM

## 2018-07-26 DIAGNOSIS — F4310 Post-traumatic stress disorder, unspecified: Secondary | ICD-10-CM | POA: Diagnosis not present

## 2018-08-17 ENCOUNTER — Ambulatory Visit (INDEPENDENT_AMBULATORY_CARE_PROVIDER_SITE_OTHER): Payer: 59 | Admitting: Psychology

## 2018-08-17 DIAGNOSIS — F4310 Post-traumatic stress disorder, unspecified: Secondary | ICD-10-CM

## 2018-08-23 ENCOUNTER — Ambulatory Visit (INDEPENDENT_AMBULATORY_CARE_PROVIDER_SITE_OTHER): Payer: 59 | Admitting: Psychology

## 2018-08-23 DIAGNOSIS — F411 Generalized anxiety disorder: Secondary | ICD-10-CM | POA: Diagnosis not present

## 2018-08-23 DIAGNOSIS — F4310 Post-traumatic stress disorder, unspecified: Secondary | ICD-10-CM | POA: Diagnosis not present

## 2018-08-30 ENCOUNTER — Ambulatory Visit (INDEPENDENT_AMBULATORY_CARE_PROVIDER_SITE_OTHER): Payer: 59 | Admitting: Psychology

## 2018-08-30 DIAGNOSIS — F411 Generalized anxiety disorder: Secondary | ICD-10-CM | POA: Diagnosis not present

## 2018-08-30 DIAGNOSIS — F4310 Post-traumatic stress disorder, unspecified: Secondary | ICD-10-CM | POA: Diagnosis not present

## 2018-09-06 ENCOUNTER — Ambulatory Visit (INDEPENDENT_AMBULATORY_CARE_PROVIDER_SITE_OTHER): Payer: 59 | Admitting: Psychology

## 2018-09-06 DIAGNOSIS — F411 Generalized anxiety disorder: Secondary | ICD-10-CM

## 2018-09-06 DIAGNOSIS — F4310 Post-traumatic stress disorder, unspecified: Secondary | ICD-10-CM

## 2018-09-13 ENCOUNTER — Ambulatory Visit: Payer: 59 | Admitting: Psychology

## 2018-09-17 ENCOUNTER — Ambulatory Visit (INDEPENDENT_AMBULATORY_CARE_PROVIDER_SITE_OTHER): Payer: 59 | Admitting: Psychology

## 2018-09-17 DIAGNOSIS — F4310 Post-traumatic stress disorder, unspecified: Secondary | ICD-10-CM | POA: Diagnosis not present

## 2018-09-20 ENCOUNTER — Ambulatory Visit: Payer: 59 | Admitting: Psychology

## 2018-09-21 ENCOUNTER — Ambulatory Visit (INDEPENDENT_AMBULATORY_CARE_PROVIDER_SITE_OTHER): Payer: 59 | Admitting: Psychology

## 2018-09-21 DIAGNOSIS — F4310 Post-traumatic stress disorder, unspecified: Secondary | ICD-10-CM

## 2018-09-27 ENCOUNTER — Ambulatory Visit (INDEPENDENT_AMBULATORY_CARE_PROVIDER_SITE_OTHER): Payer: 59 | Admitting: Psychology

## 2018-09-27 DIAGNOSIS — F4322 Adjustment disorder with anxiety: Secondary | ICD-10-CM

## 2018-09-27 DIAGNOSIS — F4310 Post-traumatic stress disorder, unspecified: Secondary | ICD-10-CM

## 2018-09-29 ENCOUNTER — Encounter: Payer: Managed Care, Other (non HMO) | Admitting: Family

## 2018-09-30 ENCOUNTER — Encounter: Payer: Self-pay | Admitting: Family Medicine

## 2018-09-30 ENCOUNTER — Ambulatory Visit (INDEPENDENT_AMBULATORY_CARE_PROVIDER_SITE_OTHER): Payer: Managed Care, Other (non HMO) | Admitting: Family Medicine

## 2018-09-30 ENCOUNTER — Other Ambulatory Visit (HOSPITAL_COMMUNITY)
Admission: RE | Admit: 2018-09-30 | Discharge: 2018-09-30 | Disposition: A | Discharge status: Home / Self Care | Payer: Managed Care, Other (non HMO) | Source: Ambulatory Visit | Attending: Family Medicine | Admitting: Family Medicine

## 2018-09-30 ENCOUNTER — Other Ambulatory Visit: Payer: Self-pay

## 2018-09-30 DIAGNOSIS — F329 Major depressive disorder, single episode, unspecified: Secondary | ICD-10-CM

## 2018-09-30 DIAGNOSIS — Z3A11 11 weeks gestation of pregnancy: Secondary | ICD-10-CM

## 2018-09-30 DIAGNOSIS — F32A Depression, unspecified: Secondary | ICD-10-CM | POA: Insufficient documentation

## 2018-09-30 DIAGNOSIS — Z3401 Encounter for supervision of normal first pregnancy, first trimester: Secondary | ICD-10-CM

## 2018-09-30 DIAGNOSIS — O99341 Other mental disorders complicating pregnancy, first trimester: Secondary | ICD-10-CM | POA: Diagnosis not present

## 2018-09-30 DIAGNOSIS — Z3400 Encounter for supervision of normal first pregnancy, unspecified trimester: Secondary | ICD-10-CM | POA: Insufficient documentation

## 2018-09-30 HISTORY — DX: Depression, unspecified: F32.A

## 2018-09-30 MED ORDER — FAMOTIDINE 20 MG PO TABS: 20.0000 mg | ORAL_TABLET | Freq: Two times a day (BID) | ORAL | 3 refills | Status: DC

## 2018-09-30 NOTE — Progress Notes (Signed)
DATING AND VIABILITY SONOGRAM   Katie Butler is a 23 y.o. year old G1P0000 with LMP Patient's last menstrual period was 07/10/2018 (exact date). which would correlate to  [redacted]w[redacted]d weeks gestation.  She has regular menstrual cycles.   She is here today for a confirmatory initial sonogram.    GESTATION: SINGLETON     FETAL ACTIVITY:          Heart rate        160          The fetus is active.    GESTATIONAL AGE AND  BIOMETRICS:  Gestational criteria: Estimated Date of Delivery: 04/16/19 by LMP now at [redacted]w[redacted]d  Previous Scans:0  GESTATIONAL SAC           1.87 cm        6-5 weeks  CROWN RUMP LENGTH           0.973 cm         7-0 weeks                                                                               AVERAGE EGA(BY THIS SCAN):  7-0 weeks  WORKING EDD( midtrimester ultrasound ):  05/19/2019     TECHNICIAN COMMENTS:  Patient informed that the ultrasound is considered a limited obstetric ultrasound and is not intended to be a complete ultrasound exam. Patient also informed that the ultrasound is not being completed with the intent of assessing for fetal or placental anomalies or any pelvic abnormalities. Explained that the purpose of today's ultrasound is to assess for fetal heart rate. Patient acknowledges the purpose of the exam and the limitations of the study.       Kathrene Alu 09/30/2018 9:50 AM

## 2018-09-30 NOTE — Progress Notes (Signed)
Subjective:  Katie Butler is a G1P0000 [redacted]w[redacted]d being seen today for her first obstetrical visit.  Her obstetrical history is significant for first pregnancy. Unplanned pregnancy, but desired. Not in relationship with FOB.  Patient does intend to breast feed. Pregnancy history fully reviewed.  Does take zoloft for depression. Stable.   Patient reports heartburn.  BP 114/68   Pulse 68   Wt 127 lb (57.6 kg)   LMP 07/10/2018 (Exact Date)   BMI 19.89 kg/m   HISTORY: OB History  Gravida Para Term Preterm AB Living  1 0 0 0 0 0  SAB TAB Ectopic Multiple Live Births  0 0 0 0 0    # Outcome Date GA Lbr Len/2nd Weight Sex Delivery Anes PTL Lv  1 Current             Past Medical History:  Diagnosis Date  . Mastitis   . Mental disorder    depression    Past Surgical History:  Procedure Laterality Date  . NO PAST SURGERIES      Family History  Problem Relation Age of Onset  . Cancer Other        breast  . Arthritis Maternal Grandmother   . Fibromyalgia Maternal Grandmother   . Scoliosis Maternal Grandmother   . Arthritis Maternal Grandfather   . Hypertension Maternal Grandfather   . Cancer Maternal Grandfather        lung, prostate  . Hyperlipidemia Maternal Grandfather   . Arthritis Paternal Grandmother   . Diabetes Paternal Grandmother   . Arthritis Paternal Grandfather   . Other Father        hypoglycemia     Exam  BP 114/68   Pulse 68   Wt 127 lb (57.6 kg)   LMP 07/10/2018 (Exact Date)   BMI 19.89 kg/m   CONSTITUTIONAL: Well-developed, well-nourished female in no acute distress.  HENT:  Normocephalic, atraumatic, External right and left ear normal. Oropharynx is clear and moist EYES: Conjunctivae and EOM are normal. Pupils are equal, round, and reactive to light. No scleral icterus.  NECK: Normal range of motion, supple, no masses.  Normal thyroid.  CARDIOVASCULAR: Normal heart rate noted, regular rhythm RESPIRATORY: Clear to auscultation bilaterally.  Effort and breath sounds normal, no problems with respiration noted. BREASTS: Symmetric in size. No masses, skin changes, nipple drainage, or lymphadenopathy. ABDOMEN: Soft, normal bowel sounds, no distention noted.  No tenderness, rebound or guarding.  PELVIC: Normal appearing external genitalia; normal appearing vaginal mucosa and cervix. No abnormal discharge noted. Normal uterine size, no other palpable masses, no uterine or adnexal tenderness. MUSCULOSKELETAL: Normal range of motion. No tenderness.  No cyanosis, clubbing, or edema.  2+ distal pulses. SKIN: Skin is warm and dry. No rash noted. Not diaphoretic. No erythema. No pallor. NEUROLOGIC: Alert and oriented to person, place, and time. Normal reflexes, muscle tone coordination. No cranial nerve deficit noted. PSYCHIATRIC: Normal mood and affect. Normal behavior. Normal judgment and thought content.    Assessment:    Pregnancy: G1P0000 Patient Active Problem List   Diagnosis Date Noted  . Supervision of normal first pregnancy 09/30/2018  . Routine general medical examination at a health care facility 06/10/2012  . Proteinuria 06/08/2012      Plan:   1. Encounter for supervision of normal first pregnancy in first trimester Desires Panorama for T21 screening. Discussed practice set up, teaching practice, delivery at [facility]. - Obstetric Panel, Including HIV - Enroll Patient in Babyscripts - [facility]Chlamydia probe amp [facility] - Urine Culture   2. Depression Stable.  Problem list reviewed and updated. 75% of 30 min visit spent on counseling and coordination of care.     Levie HeritageJacob J Stinson 09/30/2018

## 2018-10-01 ENCOUNTER — Encounter: Payer: Self-pay | Admitting: Family Medicine

## 2018-10-01 DIAGNOSIS — Z283 Underimmunization status: Secondary | ICD-10-CM | POA: Insufficient documentation

## 2018-10-01 DIAGNOSIS — O09899 Supervision of other high risk pregnancies, unspecified trimester: Secondary | ICD-10-CM | POA: Insufficient documentation

## 2018-10-01 LAB — OBSTETRIC PANEL, INCLUDING HIV
Antibody Screen: NEGATIVE
Basophils Absolute: 0.1 10*3/uL (ref 0.0–0.2)
Basos: 1 %
EOS (ABSOLUTE): 0.1 10*3/uL (ref 0.0–0.4)
Eos: 1 %
HIV Screen 4th Generation wRfx: NONREACTIVE
Hematocrit: 37 % (ref 34.0–46.6)
Hemoglobin: 12.4 g/dL (ref 11.1–15.9)
Hepatitis B Surface Ag: NEGATIVE
Immature Grans (Abs): 0 10*3/uL (ref 0.0–0.1)
Immature Granulocytes: 0 %
Lymphocytes Absolute: 1.6 10*3/uL (ref 0.7–3.1)
Lymphs: 27 %
MCH: 29.7 pg (ref 26.6–33.0)
MCHC: 33.5 g/dL (ref 31.5–35.7)
MCV: 89 fL (ref 79–97)
Monocytes Absolute: 0.6 10*3/uL (ref 0.1–0.9)
Monocytes: 10 %
Neutrophils Absolute: 3.6 10*3/uL (ref 1.4–7.0)
Neutrophils: 61 %
Platelets: 268 10*3/uL (ref 150–450)
RBC: 4.17 x10E6/uL (ref 3.77–5.28)
RDW: 12.6 % (ref 11.7–15.4)
RPR Ser Ql: NONREACTIVE
Rh Factor: POSITIVE
Rubella Antibodies, IGG: <0.9 index — ABNORMAL LOW (ref >0.99)
WBC: 6 10*3/uL (ref 3.4–10.8)

## 2018-10-01 LAB — GC/CHLAMYDIA PROBE AMP (~~LOC~~) NOT AT ARMC
Chlamydia: NEGATIVE
Neisseria Gonorrhea: NEGATIVE

## 2018-10-02 LAB — URINE CULTURE

## 2018-10-04 ENCOUNTER — Ambulatory Visit (INDEPENDENT_AMBULATORY_CARE_PROVIDER_SITE_OTHER): Payer: 59 | Admitting: Psychology

## 2018-10-04 DIAGNOSIS — F4310 Post-traumatic stress disorder, unspecified: Secondary | ICD-10-CM | POA: Diagnosis not present

## 2018-10-18 ENCOUNTER — Ambulatory Visit (INDEPENDENT_AMBULATORY_CARE_PROVIDER_SITE_OTHER): Payer: 59 | Admitting: Psychology

## 2018-10-18 DIAGNOSIS — F411 Generalized anxiety disorder: Secondary | ICD-10-CM | POA: Diagnosis not present

## 2018-10-18 DIAGNOSIS — F4310 Post-traumatic stress disorder, unspecified: Secondary | ICD-10-CM | POA: Diagnosis not present

## 2018-10-25 ENCOUNTER — Ambulatory Visit (INDEPENDENT_AMBULATORY_CARE_PROVIDER_SITE_OTHER): Payer: 59 | Admitting: Psychology

## 2018-10-25 DIAGNOSIS — F4310 Post-traumatic stress disorder, unspecified: Secondary | ICD-10-CM | POA: Diagnosis not present

## 2018-10-28 ENCOUNTER — Ambulatory Visit (INDEPENDENT_AMBULATORY_CARE_PROVIDER_SITE_OTHER): Payer: Managed Care, Other (non HMO) | Admitting: Family Medicine

## 2018-10-28 ENCOUNTER — Other Ambulatory Visit: Payer: Self-pay

## 2018-10-28 VITALS — BP 112/68 | HR 78 | Wt 132.1 lb

## 2018-10-28 DIAGNOSIS — Z3401 Encounter for supervision of normal first pregnancy, first trimester: Secondary | ICD-10-CM

## 2018-10-28 DIAGNOSIS — Z3A11 11 weeks gestation of pregnancy: Secondary | ICD-10-CM

## 2018-10-28 NOTE — Progress Notes (Signed)
   PRENATAL VISIT NOTE  Subjective:  Katie Butler is a 23 y.o. G1P0000 at [redacted]w[redacted]d being seen today for ongoing prenatal care.  She is currently monitored for the following issues for this low-risk pregnancy and has Proteinuria; Routine general medical examination at a health care facility; Supervision of normal first pregnancy; Depression affecting pregnancy, antepartum; and Rubella non-immune status, antepartum on their problem list.  Patient reports no complaints.  Contractions: Not present. Vag. Bleeding: None.   . Denies leaking of fluid.   The following portions of the patient's history were reviewed and updated as appropriate: allergies, current medications, past family history, past medical history, past social history, past surgical history and problem list.   Objective:   Vitals:   10/28/18 1023  BP: 112/68  Pulse: 78  Weight: 132 lb 1.3 oz (59.9 kg)    Fetal Status: Fetal Heart Rate (bpm): 159         General:  Alert, oriented and cooperative. Patient is in no acute distress.  Skin: Skin is warm and dry. No rash noted.   Cardiovascular: Normal heart rate noted  Respiratory: Normal respiratory effort, no problems with respiration noted  Abdomen: Soft, gravid, appropriate for gestational age.  Pain/Pressure: Absent     Pelvic: Cervical exam deferred        Extremities: Normal range of motion.  Edema: None  Mental Status: Normal mood and affect. Normal behavior. Normal judgment and thought content.   Assessment and Plan:  Pregnancy: G1P0000 at [redacted]w[redacted]d 1. Encounter for supervision of normal first pregnancy in first trimester FHT normal  Preterm labor symptoms and general obstetric precautions including but not limited to vaginal bleeding, contractions, leaking of fluid and fetal movement were reviewed in detail with the patient. Please refer to After Visit Summary for other counseling recommendations.   Return in about 4 weeks (around 11/25/2018) for OB f/u.  Future  Appointments  Date Time Provider [place]  11/01/2018 11:00 AM Cottle, Lucious Groves, LCSW [facility] None  11/08/2018  2:00 PM Cottle, Bambi G, LCSW [facility] None  11/15/2018 11:00 AM Cottle, Bambi G, LCSW [facility] None  11/29/2018 11:00 AM Cottle, Bambi G, LCSW [facility] None  12/06/2018  2:00 PM Cottle, Bambi G, LCSW [facility] None  12/13/2018 11:00 AM Cottle, Bambi G, LCSW [facility] None  12/20/2018  2:00 PM Cottle, Bambi G, LCSW [facility] None  01/03/2019  2:00 PM Cottle, Bambi G, LCSW [facility] None  01/17/2019  2:00 PM Cottle, Bambi G, LCSW [facility] None  01/31/2019  2:00 PM Cottle, Bambi G, LCSW [facility] None    Truett Mainland, DO

## 2018-11-01 ENCOUNTER — Ambulatory Visit (INDEPENDENT_AMBULATORY_CARE_PROVIDER_SITE_OTHER): Payer: 59 | Admitting: Psychology

## 2018-11-01 DIAGNOSIS — F4310 Post-traumatic stress disorder, unspecified: Secondary | ICD-10-CM | POA: Diagnosis not present

## 2018-11-08 ENCOUNTER — Ambulatory Visit (INDEPENDENT_AMBULATORY_CARE_PROVIDER_SITE_OTHER): Payer: 59 | Admitting: Psychology

## 2018-11-08 DIAGNOSIS — F4310 Post-traumatic stress disorder, unspecified: Secondary | ICD-10-CM

## 2018-11-15 ENCOUNTER — Ambulatory Visit (INDEPENDENT_AMBULATORY_CARE_PROVIDER_SITE_OTHER): Payer: 59 | Admitting: Psychology

## 2018-11-15 DIAGNOSIS — F4310 Post-traumatic stress disorder, unspecified: Secondary | ICD-10-CM | POA: Diagnosis not present

## 2018-11-25 ENCOUNTER — Ambulatory Visit (INDEPENDENT_AMBULATORY_CARE_PROVIDER_SITE_OTHER): Payer: Medicaid Other | Admitting: Family Medicine

## 2018-11-25 ENCOUNTER — Other Ambulatory Visit: Payer: Self-pay

## 2018-11-25 VITALS — BP 94/52 | HR 92 | Wt 133.1 lb

## 2018-11-25 DIAGNOSIS — O99342 Other mental disorders complicating pregnancy, second trimester: Secondary | ICD-10-CM

## 2018-11-25 DIAGNOSIS — O09899 Supervision of other high risk pregnancies, unspecified trimester: Secondary | ICD-10-CM

## 2018-11-25 DIAGNOSIS — Z3A15 15 weeks gestation of pregnancy: Secondary | ICD-10-CM

## 2018-11-25 DIAGNOSIS — O99892 Other specified diseases and conditions complicating childbirth: Secondary | ICD-10-CM

## 2018-11-25 DIAGNOSIS — F329 Major depressive disorder, single episode, unspecified: Secondary | ICD-10-CM

## 2018-11-25 DIAGNOSIS — Z283 Underimmunization status: Secondary | ICD-10-CM

## 2018-11-25 DIAGNOSIS — Z141 Cystic fibrosis carrier: Secondary | ICD-10-CM

## 2018-11-25 DIAGNOSIS — Z3400 Encounter for supervision of normal first pregnancy, unspecified trimester: Secondary | ICD-10-CM

## 2018-11-25 DIAGNOSIS — F32A Depression, unspecified: Secondary | ICD-10-CM

## 2018-11-25 HISTORY — DX: Cystic fibrosis carrier: Z14.1

## 2018-11-25 NOTE — Progress Notes (Signed)
PRENATAL VISIT NOTE  Subjective:  Katie Butler is a 23 y.o. G1P0000 at 88w0dbeing seen today for ongoing prenatal care.  She is currently monitored for the following issues for this low-risk pregnancy and has Proteinuria; Routine general medical examination at a health care facility; Supervision of normal first pregnancy; Depression affecting pregnancy, antepartum; Rubella non-immune status, antepartum; and Cystic fibrosis carrier on their problem list.  Patient reports no complaints.  Contractions: Not present. Vag. Bleeding: None.  Movement: Absent. Denies leaking of fluid.   The following portions of the patient's history were reviewed and updated as appropriate: allergies, current medications, past family history, past medical history, past social history, past surgical history and problem list.   Objective:   Vitals:   11/25/18 1044  BP: (!) 94/52  Pulse: 92  Weight: 133 lb 1.9 oz (60.4 kg)    Fetal Status: Fetal Heart Rate (bpm): 150   Movement: Absent     General:  Alert, oriented and cooperative. Patient is in no acute distress.  Skin: Skin is warm and dry. No rash noted.   Cardiovascular: Normal heart rate noted  Respiratory: Normal respiratory effort, no problems with respiration noted  Abdomen: Soft, gravid, appropriate for gestational age.  Pain/Pressure: Absent     Pelvic: Cervical exam deferred        Extremities: Normal range of motion.  Edema: None  Mental Status: Normal mood and affect. Normal behavior. Normal judgment and thought content.   Assessment and Plan:  Pregnancy: G1P0000 at 130w0d. Supervision of normal first pregnancy, antepartum FHT and FH normal - AFP, Serum, Open Spina Bifida - [place] OB DETAIL +14 WK; Future  2. Cystic fibrosis carrier Paternity not certain. Recommended testing for potential partners.  3. Rubella non-immune status, antepartum MMR post delivery  4. Depression affecting pregnancy, antepartum Controlled on zoloft   Preterm labor symptoms and general obstetric precautions including but not limited to vaginal bleeding, contractions, leaking of fluid and fetal movement were reviewed in detail with the patient. Please refer to After Visit Summary for other counseling recommendations.   No follow-ups on file.  Future Appointments  Date Time Provider [place]10/26/2020 11:00 AM Cottle, BaLucious GrovesLCSW [facility] None  12/06/2018  2:00 PM Cottle, Bambi G, LCSW [facility] None  12/13/2018 11:00 AM Cottle, Bambi G, LCSW [facility] None  12/20/2018  2:00 PM Cottle, Bambi G, LCSW [facility] None  12/27/2018 11:00 AM Cottle, Bambi G, LCSW [facility] None  01/03/2019  2:00 PM Cottle, Bambi G, LCSW [facility] None  01/17/2019  2:00 PM Cottle, Bambi G, LCSW [facility] None  01/31/2019  2:00 PM Cottle, Bambi G, LCSW [facility] None    JaTruett MainlandDO

## 2018-11-29 ENCOUNTER — Ambulatory Visit (INDEPENDENT_AMBULATORY_CARE_PROVIDER_SITE_OTHER): Payer: 59 | Admitting: Psychology

## 2018-11-29 DIAGNOSIS — F4310 Post-traumatic stress disorder, unspecified: Secondary | ICD-10-CM

## 2018-11-30 ENCOUNTER — Ambulatory Visit (HOSPITAL_COMMUNITY): Payer: Medicaid Other

## 2018-12-06 ENCOUNTER — Ambulatory Visit (INDEPENDENT_AMBULATORY_CARE_PROVIDER_SITE_OTHER): Payer: 59 | Admitting: Psychology

## 2018-12-06 DIAGNOSIS — F4310 Post-traumatic stress disorder, unspecified: Secondary | ICD-10-CM | POA: Diagnosis not present

## 2018-12-13 ENCOUNTER — Ambulatory Visit: Payer: 59 | Admitting: Psychology

## 2018-12-13 ENCOUNTER — Ambulatory Visit (INDEPENDENT_AMBULATORY_CARE_PROVIDER_SITE_OTHER): Payer: 59 | Admitting: Psychology

## 2018-12-13 DIAGNOSIS — F4310 Post-traumatic stress disorder, unspecified: Secondary | ICD-10-CM

## 2018-12-20 ENCOUNTER — Ambulatory Visit (INDEPENDENT_AMBULATORY_CARE_PROVIDER_SITE_OTHER): Payer: 59 | Admitting: Psychology

## 2018-12-20 DIAGNOSIS — F4310 Post-traumatic stress disorder, unspecified: Secondary | ICD-10-CM

## 2018-12-23 ENCOUNTER — Encounter (HOSPITAL_COMMUNITY): Payer: Self-pay

## 2018-12-23 ENCOUNTER — Ambulatory Visit (HOSPITAL_COMMUNITY): Payer: Managed Care, Other (non HMO) | Admitting: *Deleted

## 2018-12-23 ENCOUNTER — Other Ambulatory Visit: Payer: Self-pay

## 2018-12-23 ENCOUNTER — Ambulatory Visit (HOSPITAL_COMMUNITY)
Admission: RE | Admit: 2018-12-23 | Discharge: 2018-12-23 | Disposition: A | Discharge status: Home / Self Care | Payer: Managed Care, Other (non HMO) | Source: Ambulatory Visit | Attending: Family Medicine | Admitting: Family Medicine

## 2018-12-23 VITALS — BP 123/70 | HR 77 | Temp 98.2°F

## 2018-12-23 DIAGNOSIS — O09892 Supervision of other high risk pregnancies, second trimester: Secondary | ICD-10-CM | POA: Diagnosis not present

## 2018-12-23 DIAGNOSIS — O358XX0 Maternal care for other (suspected) fetal abnormality and damage, not applicable or unspecified: Secondary | ICD-10-CM

## 2018-12-23 DIAGNOSIS — O0990 Supervision of high risk pregnancy, unspecified, unspecified trimester: Secondary | ICD-10-CM

## 2018-12-23 DIAGNOSIS — Z3400 Encounter for supervision of normal first pregnancy, unspecified trimester: Secondary | ICD-10-CM | POA: Diagnosis not present

## 2018-12-23 DIAGNOSIS — Z3A20 20 weeks gestation of pregnancy: Secondary | ICD-10-CM

## 2018-12-24 ENCOUNTER — Other Ambulatory Visit (HOSPITAL_COMMUNITY): Payer: Self-pay | Admitting: *Deleted

## 2018-12-24 DIAGNOSIS — Z362 Encounter for other antenatal screening follow-up: Secondary | ICD-10-CM

## 2018-12-27 ENCOUNTER — Ambulatory Visit: Payer: 59 | Admitting: Psychology

## 2018-12-27 ENCOUNTER — Ambulatory Visit (INDEPENDENT_AMBULATORY_CARE_PROVIDER_SITE_OTHER): Payer: 59 | Admitting: Psychology

## 2018-12-27 DIAGNOSIS — F4310 Post-traumatic stress disorder, unspecified: Secondary | ICD-10-CM

## 2019-01-03 ENCOUNTER — Encounter: Payer: Self-pay | Admitting: Family

## 2019-01-03 ENCOUNTER — Ambulatory Visit (INDEPENDENT_AMBULATORY_CARE_PROVIDER_SITE_OTHER): Payer: 59 | Admitting: Psychology

## 2019-01-03 DIAGNOSIS — F4310 Post-traumatic stress disorder, unspecified: Secondary | ICD-10-CM | POA: Diagnosis not present

## 2019-01-05 ENCOUNTER — Encounter: Payer: Self-pay | Admitting: Obstetrics & Gynecology

## 2019-01-05 ENCOUNTER — Ambulatory Visit (INDEPENDENT_AMBULATORY_CARE_PROVIDER_SITE_OTHER): Payer: Medicaid Other | Admitting: Obstetrics & Gynecology

## 2019-01-05 ENCOUNTER — Other Ambulatory Visit: Payer: Self-pay

## 2019-01-05 VITALS — BP 109/64 | HR 92 | Wt 150.0 lb

## 2019-01-05 DIAGNOSIS — Z3400 Encounter for supervision of normal first pregnancy, unspecified trimester: Secondary | ICD-10-CM

## 2019-01-05 DIAGNOSIS — O99891 Other specified diseases and conditions complicating pregnancy: Secondary | ICD-10-CM

## 2019-01-05 DIAGNOSIS — Z283 Underimmunization status: Secondary | ICD-10-CM

## 2019-01-05 DIAGNOSIS — O99342 Other mental disorders complicating pregnancy, second trimester: Secondary | ICD-10-CM

## 2019-01-05 DIAGNOSIS — Z141 Cystic fibrosis carrier: Secondary | ICD-10-CM

## 2019-01-05 DIAGNOSIS — O99340 Other mental disorders complicating pregnancy, unspecified trimester: Secondary | ICD-10-CM

## 2019-01-05 DIAGNOSIS — O09899 Supervision of other high risk pregnancies, unspecified trimester: Secondary | ICD-10-CM

## 2019-01-05 DIAGNOSIS — F329 Major depressive disorder, single episode, unspecified: Secondary | ICD-10-CM

## 2019-01-05 DIAGNOSIS — Z3A20 20 weeks gestation of pregnancy: Secondary | ICD-10-CM

## 2019-01-05 MED ORDER — SERTRALINE HCL 50 MG PO TABS: 50.0000 mg | ORAL_TABLET | Freq: Every day | ORAL | 3 refills | Status: DC

## 2019-01-05 NOTE — Progress Notes (Signed)
   PRENATAL VISIT NOTE  Subjective:  Katie Butler is a 23 y.o. G1P0000 at 11w6dbeing seen today for ongoing prenatal care.  She is currently monitored for the following issues for this low-risk pregnancy and has Proteinuria; Supervision of normal first pregnancy; Depression affecting pregnancy, antepartum; Rubella non-immune status, antepartum; and Cystic fibrosis carrier on their problem list.  Patient reports no complaints.  Contractions: Not present. Vag. Bleeding: None.  Movement: Present. Denies leaking of fluid.   The following portions of the patient's history were reviewed and updated as appropriate: allergies, current medications, past family history, past medical history, past social history, past surgical history and problem list.   Objective:   Vitals:   01/05/19 1340  BP: 109/64  Pulse: 92  Weight: 150 lb (68 kg)    Fetal Status: Fetal Heart Rate (bpm): 148   Movement: Present     General:  Alert, oriented and cooperative. Patient is in no acute distress.  Skin: Skin is warm and dry. No rash noted.   Cardiovascular: Normal heart rate noted  Respiratory: Normal respiratory effort, no problems with respiration noted  Abdomen: Soft, gravid, appropriate for gestational age.  Pain/Pressure: Absent     Pelvic: Cervical exam deferred        Extremities: Normal range of motion.  Edema: None  Mental Status: Normal mood and affect. Normal behavior. Normal judgment and thought content.   Assessment and Plan:  Pregnancy: G1P0000 at 225w6d. Supervision of normal first pregnancy, antepartum Has f/u for dating confirmation 12/21  EDC changed after [place] pt is not at all sure of her LMP.    2. Rubella non-immune status, antepartum Needs MMR PP    3. Depression affecting pregnancy, antepartum Off meds x 3 days as she ran out of her meds.  Zoloft '50mg'$  refilled today  4. Cystic fibrosis carrier Paternity uncertain  Preterm labor symptoms and general obstetric precautions  including but not limited to vaginal bleeding, contractions, leaking of fluid and fetal movement were reviewed in detail with the patient. Please refer to After Visit Summary for other counseling recommendations.   No follow-ups on file.  Future Appointments  Date Time Provider [place]12/08/2018 11:00 AM Cottle, BaLucious GrovesLCSW [facility] None  01/17/2019  2:00 PM Cottle, Bambi G, LCSW [facility] None  01/24/2019 10:15 AM [facility] [place] [facility]  01/24/2019 10:25 AM [facility] NURSE [facility]  01/31/2019  2:00 PM Cottle, Bambi G, LCSW [facility] None    CaLavonia DraftsMD

## 2019-01-05 NOTE — Patient Instructions (Signed)

## 2019-01-10 ENCOUNTER — Ambulatory Visit: Payer: 59 | Admitting: Psychology

## 2019-01-10 ENCOUNTER — Ambulatory Visit (INDEPENDENT_AMBULATORY_CARE_PROVIDER_SITE_OTHER): Payer: 59 | Admitting: Psychology

## 2019-01-10 DIAGNOSIS — F4310 Post-traumatic stress disorder, unspecified: Secondary | ICD-10-CM | POA: Diagnosis not present

## 2019-01-17 ENCOUNTER — Ambulatory Visit (INDEPENDENT_AMBULATORY_CARE_PROVIDER_SITE_OTHER): Payer: 59 | Admitting: Psychology

## 2019-01-17 DIAGNOSIS — F4310 Post-traumatic stress disorder, unspecified: Secondary | ICD-10-CM | POA: Diagnosis not present

## 2019-01-24 ENCOUNTER — Ambulatory Visit: Payer: 59 | Admitting: Psychology

## 2019-01-24 ENCOUNTER — Ambulatory Visit (HOSPITAL_COMMUNITY)
Admission: RE | Admit: 2019-01-24 | Discharge: 2019-01-24 | Disposition: A | Discharge status: Home / Self Care | Payer: Managed Care, Other (non HMO) | Source: Ambulatory Visit | Attending: Obstetrics and Gynecology | Admitting: Obstetrics and Gynecology

## 2019-01-24 ENCOUNTER — Ambulatory Visit (HOSPITAL_COMMUNITY): Payer: Managed Care, Other (non HMO) | Admitting: *Deleted

## 2019-01-24 ENCOUNTER — Encounter (HOSPITAL_COMMUNITY): Payer: Self-pay

## 2019-01-24 ENCOUNTER — Other Ambulatory Visit: Payer: Self-pay

## 2019-01-24 VITALS — BP 117/67 | HR 86 | Temp 97.8°F

## 2019-01-24 DIAGNOSIS — Z362 Encounter for other antenatal screening follow-up: Secondary | ICD-10-CM | POA: Diagnosis not present

## 2019-01-24 DIAGNOSIS — O0990 Supervision of high risk pregnancy, unspecified, unspecified trimester: Secondary | ICD-10-CM | POA: Insufficient documentation

## 2019-01-24 DIAGNOSIS — O09892 Supervision of other high risk pregnancies, second trimester: Secondary | ICD-10-CM

## 2019-01-24 DIAGNOSIS — O358XX0 Maternal care for other (suspected) fetal abnormality and damage, not applicable or unspecified: Secondary | ICD-10-CM

## 2019-01-24 DIAGNOSIS — Z3A25 25 weeks gestation of pregnancy: Secondary | ICD-10-CM

## 2019-01-24 IMAGING — US US MFM OB FOLLOW-UP
1 series · 14 of 28 positions shown · non-contrast
Comparison: none

[Series 1: us mfm ob follow-up · 14 of 68 slices shown]
[im 3/68]
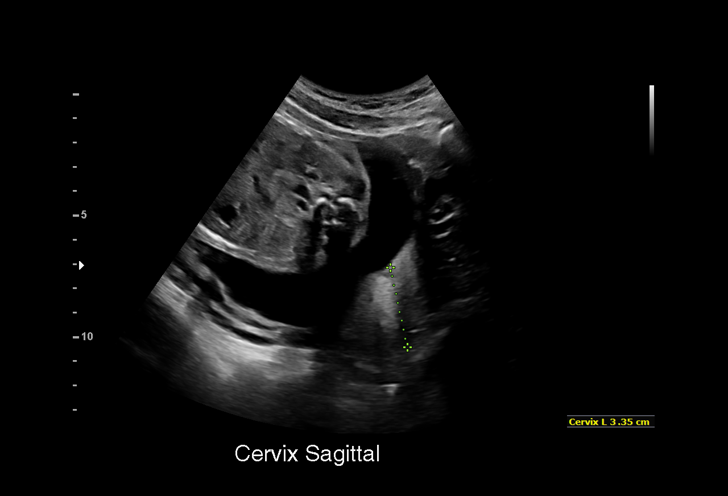
[im 8/68]
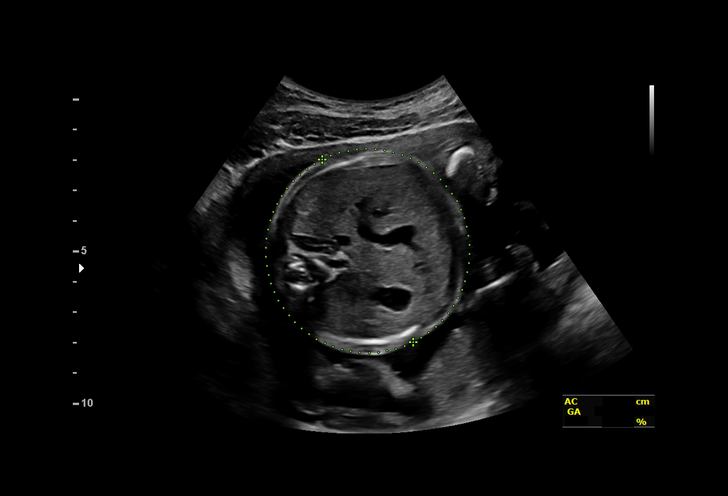
[im 13/68]
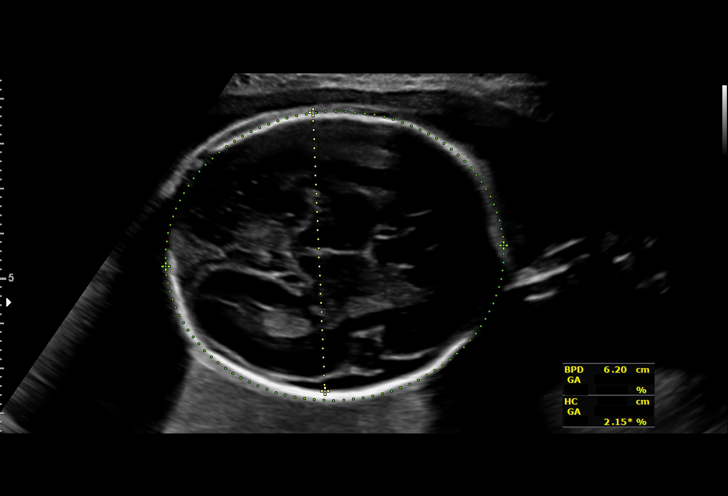
[im 18/68]
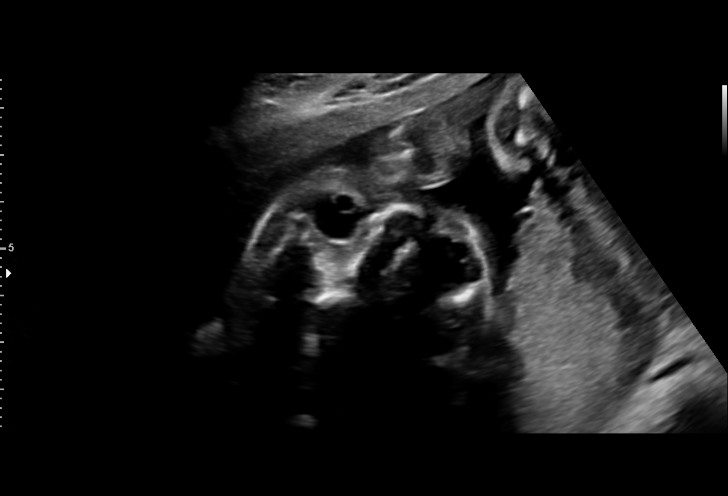
[im 23/68]
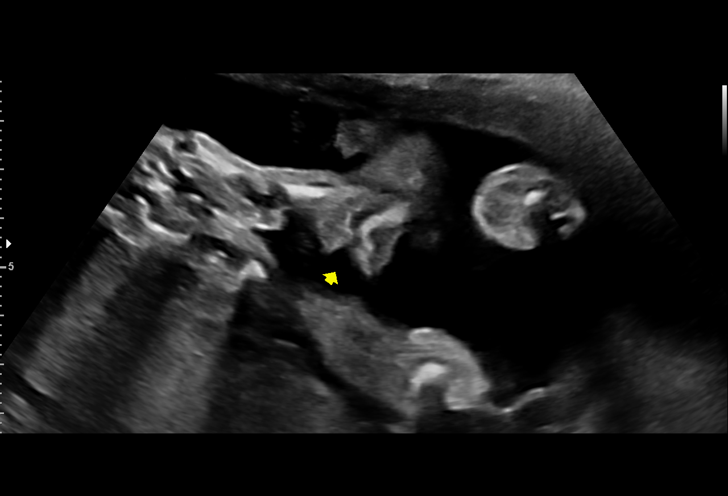
[im 28/68]
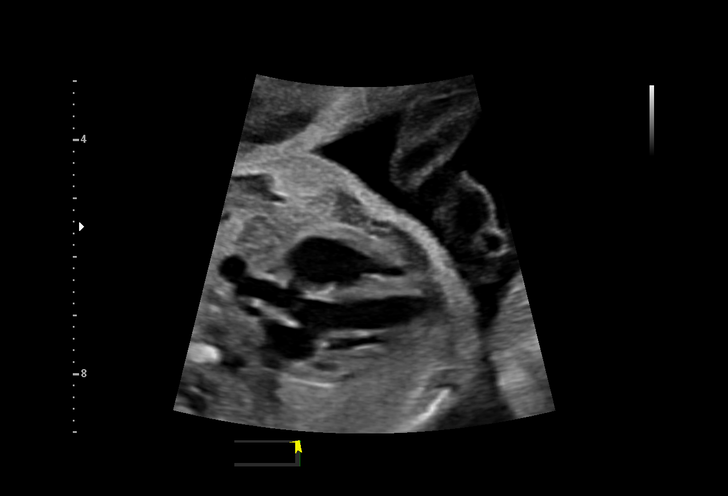
[im 33/68]
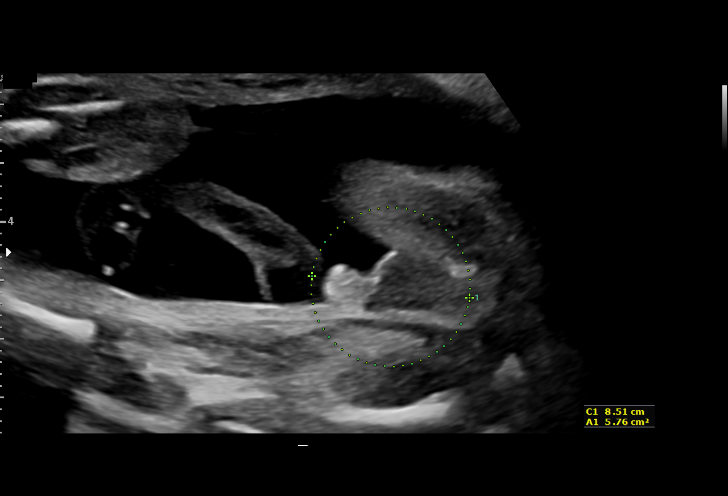
[im 38/68]
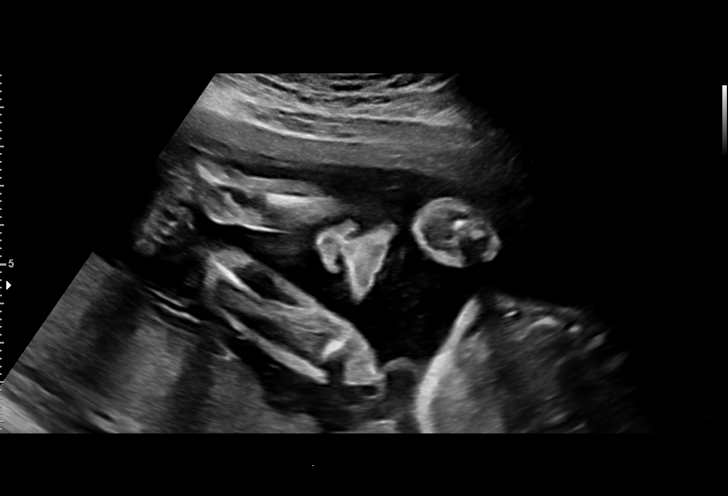
[im 43/68]
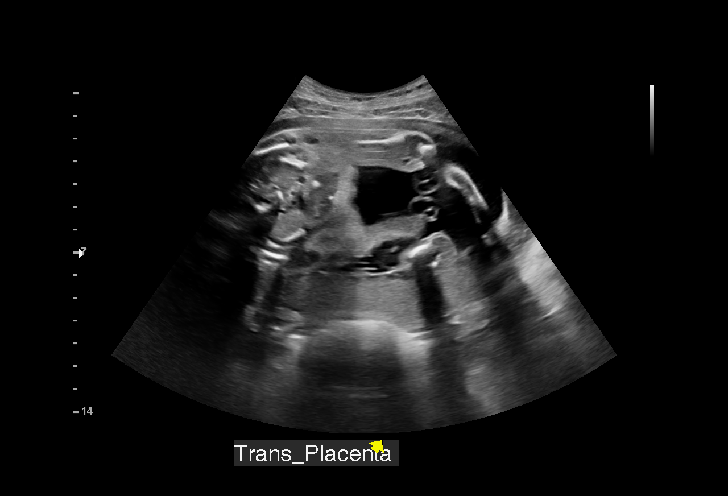
[im 48/68]
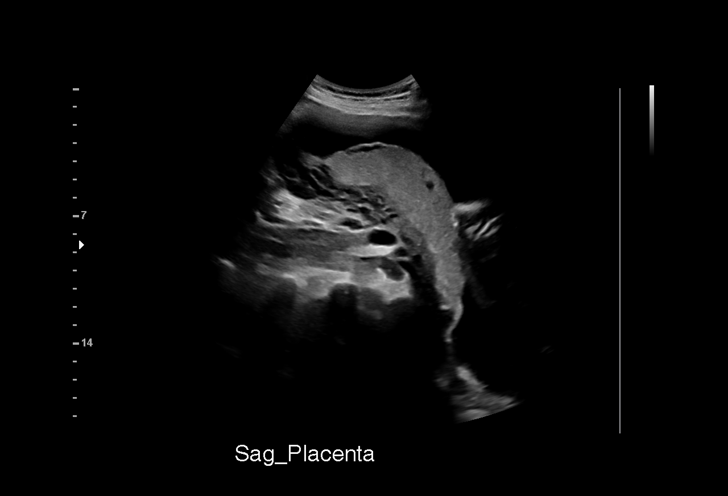
[im 53/68]
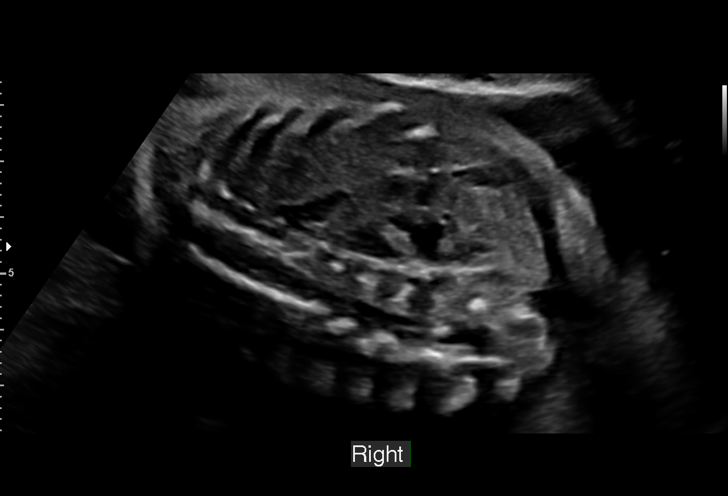
[im 58/68]
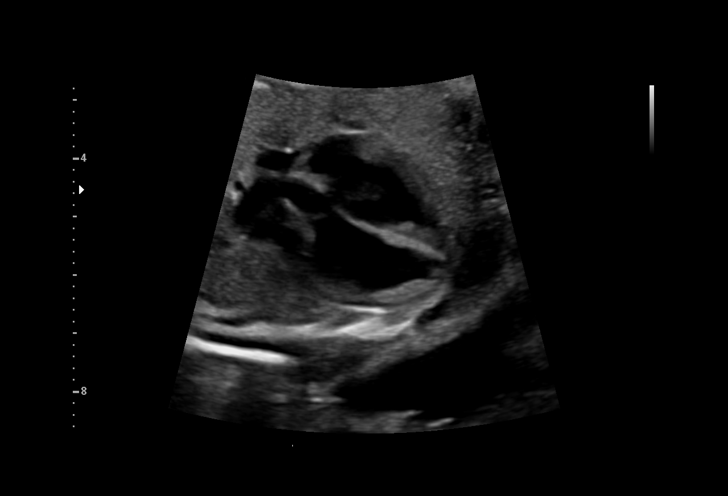
[im 63/68]
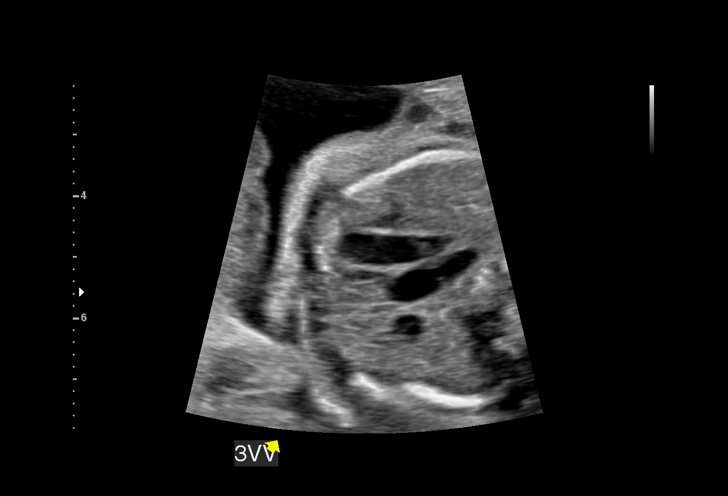
[im 68/68]
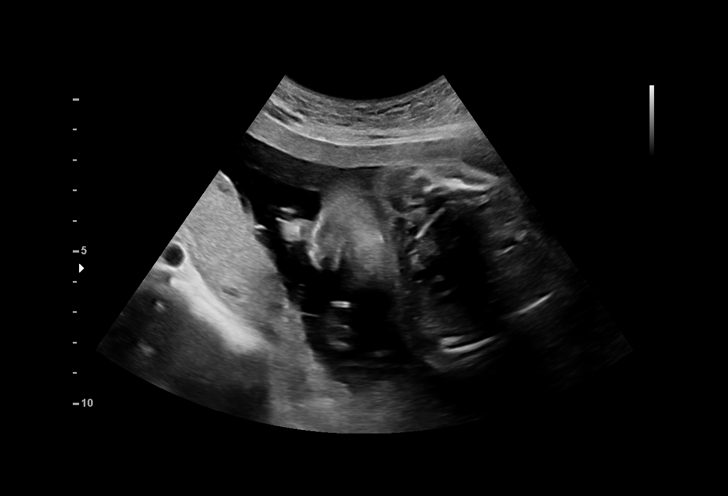

[14 of 28 positions shown; findings below may reference images not displayed]

----------------------------------------------------------------------

 ----------------------------------------------------------------------
Indications

  Encounter for other antenatal screening        [5M]
  follow-up
  25 weeks gestation of pregnancy
  Echogenic intracardiac focus of the heart      [5M]
  (EIF)
  Cystic Fibrosis (CF) Carrier, second trimester [5M]
  Low Risk NIPS
 ----------------------------------------------------------------------
Fetal Evaluation

 Num Of Fetuses:         1
 Fetal Heart Rate(bpm):  141
 Cardiac Activity:       Observed
 Presentation:           Breech
 Placenta:               Posterior
 P. Cord Insertion:      Previously Visualized

 Amniotic Fluid
 AFI FV:      Within normal limits

                             Largest Pocket(cm)

Biometry

 BPD:      61.6  mm     G. Age:  25w 0d         27  %    CI:        77.19   %    70 - 86
                                                         FL/HC:      20.7   %    18.7 -
 HC:       222   mm     G. Age:  24w 2d          4  %    HC/AC:      1.06        1.04 -
 AC:      209.7  mm     G. Age:  25w 4d         44  %    FL/BPD:     74.5   %    71 - 87
 FL:       45.9  mm     G. Age:  25w 2d         30  %    FL/AC:      21.9   %    20 - 24
 HUM:      44.2  mm     G. Age:  26w 2d         65  %
 LV:        4.4  mm
 Est. FW:     788  gm    1 lb 12 oz      32  %
OB History

 Gravidity:    1
Gestational Age

 LMP:           28w 2d        Date:  [DATE]                 EDD:   [DATE]
 U/S Today:     25w 0d                                        EDD:   [DATE]
 Best:          25w 3d     Det. By:  U/S  ([DATE])          EDD:   [DATE]
Anatomy

 Cranium:               Appears normal         LVOT:                   Appears normal
 Cavum:                 Previously seen        Aortic Arch:            Previously seen
 Ventricles:            Appears normal         Ductal Arch:            Previously seen
 Choroid Plexus:        Previously seen        Diaphragm:              Appears normal
 Cerebellum:            Previously seen        Stomach:                Appears normal, left
                                                                       sided
 Posterior Fossa:       Previously seen        Abdomen:                Appears normal
 Nuchal Fold:           Previously seen        Abdominal Wall:         Previously seen
 Face:                  Appears normal         Cord Vessels:           Previously seen
                        (orbits and profile)
 Lips:                  Appears normal         Kidneys:                Appear normal
 Palate:                Not well visualized    Bladder:                Appears normal
 Thoracic:              Appears normal         Spine:                  Previously seen
 Heart:                 Echogenic focus        Upper Extremities:      Previously seen
                        in LV prev seen
 RVOT:                  Previously seen        Lower Extremities:      Previously seen

 Other:  Male gender. Technically difficult due to fetal position.
Cervix Uterus Adnexa

 Cervix
 Length:           3.35  cm.
 Normal appearance by transabdominal scan.
Impression

 Patient returned for completion of fetal anatomy. Amniotic
 fluid is normal and good fetal activity is seen. Fetal growth is
 appropriate for gestational age. Good interval growth is seen.
 Fetal anatomical survey was completed and appears normal.
 Echogenic intracardiac focus is seen again.
Recommendations

 Follow-up scans as clinically indicated.
                 AHFIR

## 2019-01-31 ENCOUNTER — Ambulatory Visit (INDEPENDENT_AMBULATORY_CARE_PROVIDER_SITE_OTHER): Payer: 59 | Admitting: Psychology

## 2019-01-31 DIAGNOSIS — F4310 Post-traumatic stress disorder, unspecified: Secondary | ICD-10-CM

## 2019-02-02 ENCOUNTER — Other Ambulatory Visit: Payer: Self-pay

## 2019-02-02 ENCOUNTER — Ambulatory Visit (INDEPENDENT_AMBULATORY_CARE_PROVIDER_SITE_OTHER): Payer: Managed Care, Other (non HMO) | Admitting: Family Medicine

## 2019-02-02 VITALS — BP 122/59 | HR 66 | Wt 145.0 lb

## 2019-02-02 DIAGNOSIS — Z141 Cystic fibrosis carrier: Secondary | ICD-10-CM

## 2019-02-02 DIAGNOSIS — Z283 Underimmunization status: Secondary | ICD-10-CM

## 2019-02-02 DIAGNOSIS — Z3A26 26 weeks gestation of pregnancy: Secondary | ICD-10-CM

## 2019-02-02 DIAGNOSIS — Z2839 Other underimmunization status: Secondary | ICD-10-CM

## 2019-02-02 DIAGNOSIS — Z3400 Encounter for supervision of normal first pregnancy, unspecified trimester: Secondary | ICD-10-CM

## 2019-02-02 DIAGNOSIS — O99891 Other specified diseases and conditions complicating pregnancy: Secondary | ICD-10-CM

## 2019-02-02 NOTE — Progress Notes (Signed)
   PRENATAL VISIT NOTE  Subjective:  Katie Butler is a 23 y.o. G1P0000 at 90w5dbeing seen today for ongoing prenatal care.  She is currently monitored for the following issues for this low-risk pregnancy and has Proteinuria; Supervision of normal first pregnancy; Depression affecting pregnancy, antepartum; Rubella non-immune status, antepartum; and Cystic fibrosis carrier on their problem list.  Patient reports no complaints.  Contractions: Not present. Vag. Bleeding: None.  Movement: Present. Denies leaking of fluid.   The following portions of the patient's history were reviewed and updated as appropriate: allergies, current medications, past family history, past medical history, past social history, past surgical history and problem list.   Objective:   Vitals:   02/02/19 1403  BP: (!) 122/59  Pulse: 66  Weight: 145 lb (65.8 kg)    Fetal Status: Fetal Heart Rate (bpm): 142 Fundal Height: 27 cm Movement: Present     General:  Alert, oriented and cooperative. Patient is in no acute distress.  Skin: Skin is warm and dry. No rash noted.   Cardiovascular: Normal heart rate noted  Respiratory: Normal respiratory effort, no problems with respiration noted  Abdomen: Soft, gravid, appropriate for gestational age.  Pain/Pressure: Absent     Pelvic: Cervical exam deferred        Extremities: Normal range of motion.  Edema: None  Mental Status: Normal mood and affect. Normal behavior. Normal judgment and thought content.   Assessment and Plan:  Pregnancy: G1P0000 at 245w5d1. Supervision of normal first pregnancy, antepartum FHT and FH normal  2. Rubella non-immune status, antepartum MMR post delivery  3. Cystic fibrosis carrier    Preterm labor symptoms and general obstetric precautions including but not limited to vaginal bleeding, contractions, leaking of fluid and fetal movement were reviewed in detail with the patient. Please refer to After Visit Summary for other  counseling recommendations.   Return in about 4 weeks (around 03/02/2019) for OB f/u.  Future Appointments  Date Time Provider [place]1/12/2019  2:00 PM Cottle, BaLucious GrovesLCSW [facility] None    JaTruett MainlandDO

## 2019-02-04 NOTE — L&D Delivery Note (Signed)
Katie Butler is a 24 y.o. female G1P0000 with IUP at [redacted]w[redacted]d admitted for IOL for decreased fetal movement with abnormal BPPS.  She progressed with cytotec, Pitocin and AROM augmentation to complete and pushed 20 minutes to deliver.  Delivery Note At 9:48 AM a viable female was delivered via Vaginal, Spontaneous (Presentation: Left Occiput Anterior).  APGAR: 5, 9; weight pending .   Placenta status:  spontaneous, intact.  Cord: 3 vessels with the following complications: shoulder dystocia  With delivery of head, shoulder dystocia identified.   First Maneuver: Chief Operating Officer: Suprapubic pressure Third Maneuver: [place] and delivery of infant  Total time approximately 30-40 seconds.  Infant initially with low heart rate. Cord clamped and cut at 30 seconds and infant taken to warmer for further assessment. Code APGAR called and NICU team at bedside. See NICU note.   Anesthesia: None Episiotomy: None Lacerations: 1st degree Suture Repair: 3.0 vicryl Est. Blood Loss (mL):  200  Mom to postpartum.  Baby to Couplet care / Skin to Skin.  Rolm Bookbinder CNM 04/16/2019, 10:12 AM

## 2019-02-07 ENCOUNTER — Ambulatory Visit: Payer: 59 | Admitting: Psychology

## 2019-02-07 ENCOUNTER — Ambulatory Visit (INDEPENDENT_AMBULATORY_CARE_PROVIDER_SITE_OTHER): Payer: Medicaid Other | Admitting: Psychology

## 2019-02-07 DIAGNOSIS — F4310 Post-traumatic stress disorder, unspecified: Secondary | ICD-10-CM | POA: Diagnosis not present

## 2019-02-09 ENCOUNTER — Other Ambulatory Visit: Payer: Medicaid Other

## 2019-02-09 ENCOUNTER — Other Ambulatory Visit: Payer: Self-pay

## 2019-02-09 DIAGNOSIS — Z3400 Encounter for supervision of normal first pregnancy, unspecified trimester: Secondary | ICD-10-CM

## 2019-02-09 NOTE — Progress Notes (Signed)
Patient sent to lab for 28 week labwork  

## 2019-02-10 LAB — GLUCOSE TOLERANCE, 2 HOURS W/ 1HR
Glucose, 1 hour: 140 mg/dL (ref 65–179)
Glucose, 2 hour: 99 mg/dL (ref 65–152)
Glucose, Fasting: 75 mg/dL (ref 65–91)

## 2019-02-10 LAB — CBC
Hematocrit: 34.6 % (ref 34.0–46.6)
Hemoglobin: 11.6 g/dL (ref 11.1–15.9)
MCH: 30.5 pg (ref 26.6–33.0)
MCHC: 33.5 g/dL (ref 31.5–35.7)
MCV: 91 fL (ref 79–97)
Platelets: 240 10*3/uL (ref 150–450)
RBC: 3.8 x10E6/uL (ref 3.77–5.28)
RDW: 12.6 % (ref 11.7–15.4)
WBC: 10.7 10*3/uL (ref 3.4–10.8)

## 2019-02-10 LAB — HIV ANTIBODY (ROUTINE TESTING W REFLEX): HIV Screen 4th Generation wRfx: NONREACTIVE

## 2019-02-10 LAB — RPR: RPR Ser Ql: NONREACTIVE

## 2019-02-14 ENCOUNTER — Ambulatory Visit (INDEPENDENT_AMBULATORY_CARE_PROVIDER_SITE_OTHER): Payer: Medicaid Other | Admitting: Psychology

## 2019-02-14 DIAGNOSIS — F4310 Post-traumatic stress disorder, unspecified: Secondary | ICD-10-CM

## 2019-02-23 ENCOUNTER — Telehealth: Payer: Self-pay

## 2019-02-23 NOTE — Telephone Encounter (Signed)
Pt called the office stating she has had diarrhea, vomiting,runny nose, and congestion x 2 days. 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 Pt was given website and number to schedule an appointment to have a COVID test. Understanding was voiced.  Katie Butler l Airen Stiehl, CMA

## 2019-03-03 ENCOUNTER — Other Ambulatory Visit: Payer: Self-pay

## 2019-03-03 ENCOUNTER — Ambulatory Visit (INDEPENDENT_AMBULATORY_CARE_PROVIDER_SITE_OTHER): Payer: Medicaid Other | Admitting: Family Medicine

## 2019-03-03 VITALS — BP 125/66 | HR 89 | Wt 153.0 lb

## 2019-03-03 DIAGNOSIS — Z3400 Encounter for supervision of normal first pregnancy, unspecified trimester: Secondary | ICD-10-CM

## 2019-03-03 DIAGNOSIS — Z141 Cystic fibrosis carrier: Secondary | ICD-10-CM

## 2019-03-03 DIAGNOSIS — O99343 Other mental disorders complicating pregnancy, third trimester: Secondary | ICD-10-CM

## 2019-03-03 DIAGNOSIS — O09899 Supervision of other high risk pregnancies, unspecified trimester: Secondary | ICD-10-CM

## 2019-03-03 DIAGNOSIS — O99340 Other mental disorders complicating pregnancy, unspecified trimester: Secondary | ICD-10-CM

## 2019-03-03 DIAGNOSIS — Z283 Underimmunization status: Secondary | ICD-10-CM

## 2019-03-03 DIAGNOSIS — F329 Major depressive disorder, single episode, unspecified: Secondary | ICD-10-CM

## 2019-03-03 DIAGNOSIS — Z3A30 30 weeks gestation of pregnancy: Secondary | ICD-10-CM

## 2019-03-03 MED ORDER — SERTRALINE HCL 100 MG PO TABS: 100.0000 mg | ORAL_TABLET | Freq: Every day | ORAL | 1 refills | Status: DC

## 2019-03-03 NOTE — Progress Notes (Signed)
   PRENATAL VISIT NOTE  Subjective:  Katie Butler is a 24 y.o. G1P0000 at 26w6dbeing seen today for ongoing prenatal care.  She is currently monitored for the following issues for this low-risk pregnancy and has Proteinuria; Supervision of normal first pregnancy; Depression affecting pregnancy, antepartum; Rubella non-immune status, antepartum; and Cystic fibrosis carrier on their problem list.  Patient reports increasing depression/anxiety.  Contractions: Not present. Vag. Bleeding: None.  Movement: Present. Denies leaking of fluid.   The following portions of the patient's history were reviewed and updated as appropriate: allergies, current medications, past family history, past medical history, past social history, past surgical history and problem list.   Objective:   Vitals:   03/03/19 1307  BP: 125/66  Pulse: 89  Weight: 153 lb (69.4 kg)    Fetal Status: Fetal Heart Rate (bpm): 128 Fundal Height: 31 cm Movement: Present  Presentation: Vertex  General:  Alert, oriented and cooperative. Patient is in no acute distress.  Skin: Skin is warm and dry. No rash noted.   Cardiovascular: Normal heart rate noted  Respiratory: Normal respiratory effort, no problems with respiration noted  Abdomen: Soft, gravid, appropriate for gestational age.  Pain/Pressure: Present     Pelvic: Cervical exam deferred        Extremities: Normal range of motion.  Edema: None  Mental Status: Normal mood and affect. Normal behavior. Normal judgment and thought content.   Assessment and Plan:  Pregnancy: G1P0000 at 326w6d. Supervision of normal first pregnancy, antepartum FHT and FH normal  2. Rubella non-immune status, antepartum MMR post delivery  3. Cystic fibrosis carrier  4. Depression affecting pregnancy, antepartum Increase Zoloft to '100mg'$  daily - sertraline (ZOLOFT) 100 MG tablet; Take 1 tablet (100 mg total) by mouth daily.  Dispense: 90 tablet; Refill: 1  Preterm labor symptoms and  general obstetric precautions including but not limited to vaginal bleeding, contractions, leaking of fluid and fetal movement were reviewed in detail with the patient. Please refer to After Visit Summary for other counseling recommendations.   Return in about 2 weeks (around 03/17/2019) for OB f/u.  Future Appointments  Date Time Provider [place]2/02/2019 12:00 PM Cottle, BaLucious GrovesLCSW [facility] None  03/14/2019  2:00 PM Cottle, Bambi G, LCSW [facility] None  03/28/2019  2:00 PM Cottle, Bambi G, LCSW [facility] None  04/11/2019  2:00 PM Cottle, Bambi G, LCSW [facility] None  04/25/2019  2:00 PM Cottle, Bambi G, LCSW [facility] None  05/09/2019  2:00 PM Cottle, Bambi G, LCSW [facility] None  05/23/2019  2:00 PM Cottle, Bambi G, LCSW [facility] None  06/06/2019  2:00 PM Cottle, Bambi G, LCSW [facility] None  06/20/2019  2:00 PM Cottle, Bambi G, LCSW [facility] None  07/04/2019  2:00 PM Cottle, Bambi G, LCSW [facility] None    JaTruett MainlandDO

## 2019-03-07 ENCOUNTER — Ambulatory Visit (INDEPENDENT_AMBULATORY_CARE_PROVIDER_SITE_OTHER): Payer: Medicaid Other | Admitting: Psychology

## 2019-03-07 DIAGNOSIS — F4310 Post-traumatic stress disorder, unspecified: Secondary | ICD-10-CM | POA: Diagnosis not present

## 2019-03-09 ENCOUNTER — Telehealth: Payer: Self-pay

## 2019-03-09 NOTE — Telephone Encounter (Signed)
Patient called and is 31. [redacted] weeks pregnant. Patient states for three days she is having abdominal pressure and pressure in her vaginal area. Patient states at times she is doubled over in pain. Patient also complaining of back pain.Patient denies any bleeding or leaking of fluid. Patient is unsure if baby is moving well.  Patient made aware that we don't currently have a provider in the office and she should go to MAU at [facility]. Patient states understanding and agrees with plan. Armandina Stammer RN

## 2019-03-14 ENCOUNTER — Ambulatory Visit (INDEPENDENT_AMBULATORY_CARE_PROVIDER_SITE_OTHER): Payer: Medicaid Other | Admitting: Psychology

## 2019-03-14 DIAGNOSIS — F4310 Post-traumatic stress disorder, unspecified: Secondary | ICD-10-CM | POA: Diagnosis not present

## 2019-03-14 DIAGNOSIS — F411 Generalized anxiety disorder: Secondary | ICD-10-CM

## 2019-03-15 ENCOUNTER — Encounter: Payer: Medicaid Other | Admitting: Advanced Practice Midwife

## 2019-03-16 ENCOUNTER — Other Ambulatory Visit: Payer: Self-pay

## 2019-03-16 ENCOUNTER — Encounter (HOSPITAL_COMMUNITY): Payer: Self-pay | Admitting: Obstetrics & Gynecology

## 2019-03-16 ENCOUNTER — Inpatient Hospital Stay (HOSPITAL_COMMUNITY)
Admission: AD | Admit: 2019-03-16 | Discharge: 2019-03-16 | Disposition: A | Discharge status: Home / Self Care | Payer: Medicaid Other | Attending: Obstetrics & Gynecology | Admitting: Obstetrics & Gynecology

## 2019-03-16 DIAGNOSIS — N898 Other specified noninflammatory disorders of vagina: Secondary | ICD-10-CM | POA: Insufficient documentation

## 2019-03-16 DIAGNOSIS — F329 Major depressive disorder, single episode, unspecified: Secondary | ICD-10-CM | POA: Diagnosis not present

## 2019-03-16 DIAGNOSIS — O99343 Other mental disorders complicating pregnancy, third trimester: Secondary | ICD-10-CM | POA: Insufficient documentation

## 2019-03-16 DIAGNOSIS — O26893 Other specified pregnancy related conditions, third trimester: Secondary | ICD-10-CM | POA: Insufficient documentation

## 2019-03-16 DIAGNOSIS — B9689 Other specified bacterial agents as the cause of diseases classified elsewhere: Secondary | ICD-10-CM

## 2019-03-16 DIAGNOSIS — N760 Acute vaginitis: Secondary | ICD-10-CM

## 2019-03-16 DIAGNOSIS — Z79899 Other long term (current) drug therapy: Secondary | ICD-10-CM | POA: Insufficient documentation

## 2019-03-16 DIAGNOSIS — Z3A32 32 weeks gestation of pregnancy: Secondary | ICD-10-CM | POA: Diagnosis not present

## 2019-03-16 MED ORDER — METRONIDAZOLE 500 MG PO TABS: 500.0000 mg | ORAL_TABLET | Freq: Two times a day (BID) | ORAL | 0 refills | Status: AC

## 2019-03-16 NOTE — MAU Provider Note (Signed)
Chief Complaint:  Vaginal Discharge   First Provider Initiated Contact with Patient 03/16/19 2115   HPI: Katie Butler is a 24 y.o. G1P0000 at 36w5dwho presents to maternity admissions reporting having an episode of cramping (now resolved), feeling fluid come out, then seeing odd-looking tissue at introitus of vagina. . She reports good fetal movement, denies vaginal bleeding, vaginal itching/burning, urinary symptoms, h/a, dizziness, n/v, diarrhea, constipation or fever/chills.  Vaginal Discharge The patient's primary symptoms include pelvic pain and vaginal discharge. The patient's pertinent negatives include no genital itching, genital lesions, genital odor or vaginal bleeding. This is a new problem. The current episode started today. The problem occurs intermittently. The patient is experiencing no pain (cramping earlier, not now). She is pregnant. Pertinent negatives include no back pain, chills, constipation, diarrhea, dysuria, fever, nausea or vomiting. The vaginal discharge was yellow and thin. There has been no bleeding. She has not been passing clots. She has not been passing tissue. Nothing aggravates the symptoms. She has tried nothing for the symptoms.    RN Note Pt states she left work early today because she was in so much pain.  At 1800 she felt a squirt of fluid and she states the fluid was yellow when she looked.  Pt states she then grabbed her mirror to see her vagina and states she saw something that looked like a brain just sitting there in her vagina.  Denies vaginal bleeding.    Past Medical History: Past Medical History:  Diagnosis Date  . Mastitis   . Mental disorder    depression    Past obstetric history: OB History  Gravida Para Term Preterm AB Living  1 0 0 0 0 0  SAB TAB Ectopic Multiple Live Births  0 0 0 0 0    # Outcome Date GA Lbr Len/2nd Weight Sex Delivery Anes PTL Lv  1 Current             Past Surgical History: Past Surgical History:   Procedure Laterality Date  . NO PAST SURGERIES      Family History: Family History  Problem Relation Age of Onset  . Cancer Other        breast  . Arthritis Maternal Grandmother   . Fibromyalgia Maternal Grandmother   . Scoliosis Maternal Grandmother   . Arthritis Maternal Grandfather   . Hypertension Maternal Grandfather   . Cancer Maternal Grandfather        lung, prostate  . Hyperlipidemia Maternal Grandfather   . Arthritis Paternal Grandmother   . Diabetes Paternal Grandmother   . Arthritis Paternal Grandfather   . Other Father        hypoglycemia    Social History: Social History   Tobacco Use  . Smoking status: Never Smoker  . Smokeless tobacco: Never Used  Substance Use Topics  . Alcohol use: No    Alcohol/week: 0.0 standard drinks  . Drug use: Never    Allergies: No Known Allergies  Meds:  Medications Prior to Admission  Medication Sig Dispense Refill Last Dose  . Prenatal Vit-Fe Fumarate-FA (MULTIVITAMIN-PRENATAL) 27-0.8 MG TABS tablet Take 1 tablet by mouth daily at 12 noon.   03/15/2019 at Unknown time  . sertraline (ZOLOFT) 100 MG tablet Take 1 tablet (100 mg total) by mouth daily. 90 tablet 1 03/15/2019 at Unknown time    I have reviewed patient's Past Medical Hx, Surgical Hx, Family Hx, Social Hx, medications and allergies.   ROS:  Review of Systems  Constitutional: Negative  for chills and fever.  Gastrointestinal: Negative for constipation, diarrhea, nausea and vomiting.  Genitourinary: Positive for pelvic pain and vaginal discharge. Negative for dysuria.  Musculoskeletal: Negative for back pain.   Other systems negative  Physical Exam   Patient Vitals for the past 24 hrs:  BP Temp Pulse Resp SpO2 Weight  03/16/19 2126 129/72 98 F (36.7 C) 97 12 100 % 70.5 kg   Constitutional: Well-developed, well-nourished female in no acute distress.  Cardiovascular: normal rate and rhythm Respiratory: normal effort, clear to auscultation  bilaterally GI: Abd soft, non-tender, gravid appropriate for gestational age.   No rebound or guarding. MS: Extremities nontender, no edema, normal ROM Neurologic: Alert and oriented x 4.  GU: Neg CVAT.  PELVIC EXAM: Cervix pink, visually closed, without lesion, scant yellow thin discharge, vaginal walls and external genitalia normal  Sterile speculum exam:  Tissue seen by patient is the anterior vaginal wall with rugae                                          No prolapse appreciated                                          No pooling of fliuid, negative ferning                                          Wet prep sent  Cervix long and closed, firm  FHT:  Baseline 130 , moderate variability, accelerations present, no decelerations Contractions: irritability   Labs: Results for orders placed or performed during the hospital encounter of 03/16/19 (from the past 24 hour(s))  Wet prep, genital     Status: Abnormal   Collection Time: 03/16/19  9:39 PM  Result Value Ref Range   Yeast Wet Prep HPF POC NONE SEEN NONE SEEN   Trich, Wet Prep NONE SEEN NONE SEEN   Clue Cells Wet Prep HPF POC PRESENT (A) NONE SEEN   WBC, Wet Prep HPF POC TOO NUMEROUS TO COUNT (A) NONE SEEN   Sperm NONE SEEN     O/Positive/-- (08/27 1055)  Imaging:  No results found.  MAU Course/MDM: I have ordered labs and reviewed results. Discussed results indicate bacterial vaginosis Discussed recommended treatment NST reviewed, reactive, Category I.    Assessment: Single intrauterine pregnancy at [redacted]w[redacted]d Vaginal discharge in pregnancy Uterine irritability with long and closed cervix Anterior vaginal wall partially visible at introitus  Plan: Discharge home Rx Flagyl for BV Preterm Labor precautions and fetal kick counts Follow up in Office for prenatal visits   Encouraged to return here or to other Urgent Care/ED if she develops worsening of symptoms, increase in pain, fever, or other concerning symptoms.   Pt  stable at time of discharge.  Wynelle Bourgeois CNM, MSN Certified Nurse-Midwife 03/16/2019 10:02 PM

## 2019-03-16 NOTE — Discharge Instructions (Signed)
Bacterial Vaginosis  Bacterial vaginosis is a vaginal infection that occurs when the normal balance of bacteria in the vagina is disrupted. It results from an overgrowth of certain bacteria. This is the most common vaginal infection among women ages 15-44. Because bacterial vaginosis increases your risk for STIs (sexually transmitted infections), getting treated can help reduce your risk for chlamydia, gonorrhea, herpes, and HIV (human immunodeficiency virus). Treatment is also important for preventing complications in pregnant women, because this condition can cause an early (premature) delivery. What are the causes? This condition is caused by an increase in harmful bacteria that are normally present in small amounts in the vagina. However, the reason that the condition develops is not fully understood. What increases the risk? The following factors may make you more likely to develop this condition:  Having a new sexual partner or multiple sexual partners.  Having unprotected sex.  Douching.  Having an intrauterine device (IUD).  Smoking.  Drug and alcohol abuse.  Taking certain antibiotic medicines.  Being pregnant. You cannot get bacterial vaginosis from toilet seats, bedding, swimming pools, or contact with objects around you. What are the signs or symptoms? Symptoms of this condition include:  Grey or white vaginal discharge. The discharge can also be watery or foamy.  A fish-like odor with discharge, especially after sexual intercourse or during menstruation.  Itching in and around the vagina.  Burning or pain with urination. Some women with bacterial vaginosis have no signs or symptoms. How is this diagnosed? This condition is diagnosed based on:  Your medical history.  A physical exam of the vagina.  Testing a sample of vaginal fluid under a microscope to look for a large amount of bad bacteria or abnormal cells. Your health care provider may use a cotton swab or  a small wooden spatula to collect the sample. How is this treated? This condition is treated with antibiotics. These may be given as a pill, a vaginal cream, or a medicine that is put into the vagina (suppository). If the condition comes back after treatment, a second round of antibiotics may be needed. Follow these instructions at home: Medicines  Take over-the-counter and prescription medicines only as told by your health care provider.  Take or use your antibiotic as told by your health care provider. Do not stop taking or using the antibiotic even if you start to feel better. General instructions  If you have a female sexual partner, tell her that you have a vaginal infection. She should see her health care provider and be treated if she has symptoms. If you have a female sexual partner, he does not need treatment.  During treatment: ? Avoid sexual activity until you finish treatment. ? Do not douche. ? Avoid alcohol as directed by your health care provider. ? Avoid breastfeeding as directed by your health care provider.  Drink enough water and fluids to keep your urine clear or pale yellow.  Keep the area around your vagina and rectum clean. ? Wash the area daily with warm water. ? Wipe yourself from front to back after using the toilet.  Keep all follow-up visits as told by your health care provider. This is important. How is this prevented?  Do not douche.  Wash the outside of your vagina with warm water only.  Use protection when having sex. This includes latex condoms and dental dams.  Limit how many sexual partners you have. To help prevent bacterial vaginosis, it is best to have sex with just one partner (  monogamous).  Make sure you and your sexual partner are tested for STIs.  Wear cotton or cotton-lined underwear.  Avoid wearing tight pants and pantyhose, especially during summer.  Limit the amount of alcohol that you drink.  Do not use any products that contain  nicotine or tobacco, such as cigarettes and e-cigarettes. If you need help quitting, ask your health care provider.  Do not use illegal drugs. Where to find more information  Centers for Disease Control and Prevention: www.cdc.gov/std  American Sexual Health Association (ASHA): www.ashastd.org  [place] Department of Health and Human Services, Office on Women's Health: www.womenshealth.gov/ or https://www.womenshealth.gov/a-z-topics/bacterial-vaginosis Contact a health care provider if:  Your symptoms do not improve, even after treatment.  You have more discharge or pain when urinating.  You have a fever.  You have pain in your abdomen.  You have pain during sex.  You have vaginal bleeding between periods. Summary  Bacterial vaginosis is a vaginal infection that occurs when the normal balance of bacteria in the vagina is disrupted.  Because bacterial vaginosis increases your risk for STIs (sexually transmitted infections), getting treated can help reduce your risk for chlamydia, gonorrhea, herpes, and HIV (human immunodeficiency virus). Treatment is also important for preventing complications in pregnant women, because the condition can cause an early (premature) delivery.  This condition is treated with antibiotic medicines. These may be given as a pill, a vaginal cream, or a medicine that is put into the vagina (suppository). This information is not intended to replace advice given to you by your health care provider. Make sure you discuss any questions you have with your health care provider. Document Revised: 01/02/2017 Document Reviewed: 10/06/2015 Elsevier Patient Education  2020 [employer]  

## 2019-03-16 NOTE — MAU Note (Signed)
Pt states she left work early today because she was in so much pain.   At 1800 she felt a squirt of fluid and she states the fluid was yellow when she looked.   Pt states she then grabbed her mirror to see her vagina and states she saw something that looked like a brain just sitting there in her vagina.   Denies vaginal bleeding.

## 2019-03-17 ENCOUNTER — Ambulatory Visit (INDEPENDENT_AMBULATORY_CARE_PROVIDER_SITE_OTHER): Payer: Medicaid Other | Admitting: Family Medicine

## 2019-03-17 VITALS — BP 114/61 | HR 120 | Wt 154.0 lb

## 2019-03-17 DIAGNOSIS — O99340 Other mental disorders complicating pregnancy, unspecified trimester: Secondary | ICD-10-CM

## 2019-03-17 DIAGNOSIS — F329 Major depressive disorder, single episode, unspecified: Secondary | ICD-10-CM

## 2019-03-17 DIAGNOSIS — Z283 Underimmunization status: Secondary | ICD-10-CM

## 2019-03-17 DIAGNOSIS — Z3A32 32 weeks gestation of pregnancy: Secondary | ICD-10-CM

## 2019-03-17 DIAGNOSIS — Z141 Cystic fibrosis carrier: Secondary | ICD-10-CM

## 2019-03-17 DIAGNOSIS — O09899 Supervision of other high risk pregnancies, unspecified trimester: Secondary | ICD-10-CM

## 2019-03-17 DIAGNOSIS — Z3400 Encounter for supervision of normal first pregnancy, unspecified trimester: Secondary | ICD-10-CM

## 2019-03-17 DIAGNOSIS — O99891 Other specified diseases and conditions complicating pregnancy: Secondary | ICD-10-CM

## 2019-03-17 DIAGNOSIS — O99343 Other mental disorders complicating pregnancy, third trimester: Secondary | ICD-10-CM

## 2019-03-17 LAB — WET PREP, GENITAL
Sperm: NONE SEEN
Trich, Wet Prep: NONE SEEN
Yeast Wet Prep HPF POC: NONE SEEN

## 2019-03-17 NOTE — Progress Notes (Signed)
   PRENATAL VISIT NOTE  Subjective:  Katie Butler is a 24 y.o. G1P0000 at [redacted]w[redacted]d being seen today for ongoing prenatal care.  She is currently monitored for the following issues for this low-risk pregnancy and has Proteinuria; Supervision of normal first pregnancy; Depression affecting pregnancy, antepartum; Rubella non-immune status, antepartum; and Cystic fibrosis carrier on their problem list.  Patient reports occasional contractions, vaginal pressure. Was seen yesterday in MAU - has BV and mild prolapse..  Contractions: Not present. Vag. Bleeding: None.  Movement: Present. Denies leaking of fluid.   The following portions of the patient's history were reviewed and updated as appropriate: allergies, current medications, past family history, past medical history, past social history, past surgical history and problem list.   Objective:   Vitals:   03/17/19 1335  BP: 114/61  Pulse: (!) 120  Weight: 154 lb (69.9 kg)    Fetal Status: Fetal Heart Rate (bpm): 135 Fundal Height: 34 cm Movement: Present  Presentation: Vertex  General:  Alert, oriented and cooperative. Patient is in no acute distress.  Skin: Skin is warm and dry. No rash noted.   Cardiovascular: Normal heart rate noted  Respiratory: Normal respiratory effort, no problems with respiration noted  Abdomen: Soft, gravid, appropriate for gestational age.  Pain/Pressure: Present     Pelvic: Cervical exam deferred        Extremities: Normal range of motion.  Edema: None  Mental Status: Normal mood and affect. Normal behavior. Normal judgment and thought content.   Assessment and Plan:  Pregnancy: G1P0000 at [redacted]w[redacted]d 1. Supervision of normal first pregnancy, antepartum FHT and FH normal. She would like to start maternity at 34 weeks, which I think is reasonable, given the amount of discomfort she is experiencing.  2. Rubella non-immune status, antepartum MMR post delivery  3. Cystic fibrosis carrier   4. Depression  affecting pregnancy, antepartum On zoloft. Working well  Preterm labor symptoms and general obstetric precautions including but not limited to vaginal bleeding, contractions, leaking of fluid and fetal movement were reviewed in detail with the patient. Please refer to After Visit Summary for other counseling recommendations.   No follow-ups on file.  Future Appointments  Date Time Provider Department Center  03/28/2019  2:00 PM Cottle, Bambi G, LCSW [facility] None  04/11/2019  2:00 PM Cottle, Bambi G, LCSW [facility] None  04/25/2019  2:00 PM Cottle, Bambi G, LCSW [facility] None  05/09/2019  2:00 PM Cottle, Bambi G, LCSW [facility] None  05/23/2019  2:00 PM Cottle, Bambi G, LCSW [facility] None  06/06/2019  2:00 PM Cottle, Bambi G, LCSW [facility] None  06/20/2019  2:00 PM Cottle, Bambi G, LCSW [facility] None  07/04/2019  2:00 PM Cottle, Bambi G, LCSW [facility] None    Jacob J Stinson, DO 

## 2019-03-28 ENCOUNTER — Ambulatory Visit (INDEPENDENT_AMBULATORY_CARE_PROVIDER_SITE_OTHER): Payer: Medicaid Other | Admitting: Psychology

## 2019-03-28 DIAGNOSIS — F4310 Post-traumatic stress disorder, unspecified: Secondary | ICD-10-CM

## 2019-03-31 ENCOUNTER — Ambulatory Visit (INDEPENDENT_AMBULATORY_CARE_PROVIDER_SITE_OTHER): Payer: Medicaid Other | Admitting: Family Medicine

## 2019-03-31 ENCOUNTER — Other Ambulatory Visit: Payer: Self-pay

## 2019-03-31 VITALS — BP 118/77 | HR 108 | Wt 153.0 lb

## 2019-03-31 DIAGNOSIS — Z3403 Encounter for supervision of normal first pregnancy, third trimester: Secondary | ICD-10-CM

## 2019-03-31 DIAGNOSIS — Z141 Cystic fibrosis carrier: Secondary | ICD-10-CM

## 2019-03-31 DIAGNOSIS — Z2839 Other underimmunization status: Secondary | ICD-10-CM

## 2019-03-31 DIAGNOSIS — Z3A34 34 weeks gestation of pregnancy: Secondary | ICD-10-CM

## 2019-03-31 DIAGNOSIS — Z029 Encounter for administrative examinations, unspecified: Secondary | ICD-10-CM

## 2019-03-31 DIAGNOSIS — Z283 Underimmunization status: Secondary | ICD-10-CM

## 2019-03-31 DIAGNOSIS — O99891 Other specified diseases and conditions complicating pregnancy: Secondary | ICD-10-CM

## 2019-03-31 NOTE — Progress Notes (Signed)
   PRENATAL VISIT NOTE  Subjective:  Katie Butler is a 24 y.o. G1P0000 at 53w6dbeing seen today for ongoing prenatal care.  She is currently monitored for the following issues for this low-risk pregnancy and has Proteinuria; Supervision of normal first pregnancy; Depression affecting pregnancy, antepartum; Rubella non-immune status, antepartum; and Cystic fibrosis carrier on their problem list.  Patient reports occasional contractions.  Contractions: Irritability. Vag. Bleeding: None.  Movement: Present. Denies leaking of fluid.   The following portions of the patient's history were reviewed and updated as appropriate: allergies, current medications, past family history, past medical history, past social history, past surgical history and problem list.   Objective:   Vitals:   03/31/19 1315  BP: 118/77  Pulse: (!) 108  Weight: 153 lb (69.4 kg)    Fetal Status: Fetal Heart Rate (bpm): 133 Fundal Height: 35 cm Movement: Present  Presentation: Vertex  General:  Alert, oriented and cooperative. Patient is in no acute distress.  Skin: Skin is warm and dry. No rash noted.   Cardiovascular: Normal heart rate noted  Respiratory: Normal respiratory effort, no problems with respiration noted  Abdomen: Soft, gravid, appropriate for gestational age.  Pain/Pressure: Present     Pelvic: Cervical exam performed Dilation: 1 Effacement (%): Thick Station: -3  Extremities: Normal range of motion.  Edema: None  Mental Status: Normal mood and affect. Normal behavior. Normal judgment and thought content.   Assessment and Plan:  Pregnancy: G1P0000 at 344w6d. Encounter for supervision of normal first pregnancy in third trimester FHT and FH normal  2. Rubella non-immune status, antepartum MMR post delivery  3. Cystic fibrosis carrier   Preterm labor symptoms and general obstetric precautions including but not limited to vaginal bleeding, contractions, leaking of fluid and fetal movement were  reviewed in detail with the patient. Please refer to After Visit Summary for other counseling recommendations.   Return in about 2 weeks (around 04/14/2019) for OB f/u, GBS, In Office.  Future Appointments  Date Time Provider [place]/09/2019  2:00 PM Cottle, BaLucious GrovesLCSW [facility] None  04/12/2019  9:10 AM WiSeabron SpatesCNM [facility] None  04/25/2019  2:00 PM Cottle, Bambi G, LCSW [facility] None  05/09/2019  2:00 PM Cottle, Bambi G, LCSW [facility] None  05/23/2019  2:00 PM Cottle, Bambi G, LCSW [facility] None  06/06/2019  2:00 PM Cottle, Bambi G, LCSW [facility] None  06/20/2019  2:00 PM Cottle, Bambi G, LCSW [facility] None  07/04/2019  2:00 PM Cottle, Bambi G, LCSW [facility] None    JaTruett MainlandDO

## 2019-04-05 ENCOUNTER — Encounter (HOSPITAL_COMMUNITY): Payer: Self-pay | Admitting: Family Medicine

## 2019-04-05 ENCOUNTER — Other Ambulatory Visit: Payer: Self-pay

## 2019-04-05 ENCOUNTER — Telehealth: Payer: Self-pay

## 2019-04-05 ENCOUNTER — Observation Stay (HOSPITAL_COMMUNITY)
Admission: AD | Admit: 2019-04-05 | Discharge: 2019-04-06 | Disposition: A | Discharge status: Home / Self Care | Payer: Medicaid Other | Attending: Family Medicine | Admitting: Family Medicine

## 2019-04-05 ENCOUNTER — Inpatient Hospital Stay (HOSPITAL_BASED_OUTPATIENT_CLINIC_OR_DEPARTMENT_OTHER): Payer: Medicaid Other

## 2019-04-05 DIAGNOSIS — O368130 Decreased fetal movements, third trimester, not applicable or unspecified: Secondary | ICD-10-CM

## 2019-04-05 DIAGNOSIS — N898 Other specified noninflammatory disorders of vagina: Secondary | ICD-10-CM | POA: Insufficient documentation

## 2019-04-05 DIAGNOSIS — Z20822 Contact with and (suspected) exposure to covid-19: Secondary | ICD-10-CM | POA: Diagnosis not present

## 2019-04-05 DIAGNOSIS — R102 Pelvic and perineal pain: Secondary | ICD-10-CM | POA: Diagnosis not present

## 2019-04-05 DIAGNOSIS — Z3403 Encounter for supervision of normal first pregnancy, third trimester: Secondary | ICD-10-CM | POA: Insufficient documentation

## 2019-04-05 DIAGNOSIS — O368190 Decreased fetal movements, unspecified trimester, not applicable or unspecified: Secondary | ICD-10-CM

## 2019-04-05 DIAGNOSIS — Z3A35 35 weeks gestation of pregnancy: Secondary | ICD-10-CM | POA: Insufficient documentation

## 2019-04-05 DIAGNOSIS — O289 Unspecified abnormal findings on antenatal screening of mother: Secondary | ICD-10-CM

## 2019-04-05 DIAGNOSIS — Z3400 Encounter for supervision of normal first pregnancy, unspecified trimester: Secondary | ICD-10-CM

## 2019-04-05 DIAGNOSIS — Z0371 Encounter for suspected problem with amniotic cavity and membrane ruled out: Secondary | ICD-10-CM | POA: Diagnosis present

## 2019-04-05 DIAGNOSIS — O288 Other abnormal findings on antenatal screening of mother: Secondary | ICD-10-CM

## 2019-04-05 LAB — WET PREP, GENITAL
Clue Cells Wet Prep HPF POC: NONE SEEN
Sperm: NONE SEEN
Trich, Wet Prep: NONE SEEN
Yeast Wet Prep HPF POC: NONE SEEN

## 2019-04-05 LAB — ABO/RH: ABO/RH(D): O POS

## 2019-04-05 LAB — AMNISURE RUPTURE OF MEMBRANE (ROM) NOT AT ARMC: Amnisure ROM: NEGATIVE

## 2019-04-05 LAB — TYPE AND SCREEN
ABO/RH(D): O POS
Antibody Screen: NEGATIVE

## 2019-04-05 MED ORDER — BETAMETHASONE SOD PHOS & ACET 6 (3-3) MG/ML IJ SUSP
12.0000 mg | INTRAMUSCULAR | Status: AC
  Administered 2019-04-05 – 2019-04-06 (×2): 12 mg via INTRAMUSCULAR
  Filled 2019-04-05: qty 5

## 2019-04-05 MED ORDER — SERTRALINE HCL 50 MG PO TABS: 100.0000 mg | ORAL_TABLET | Freq: Every day | ORAL | Status: DC

## 2019-04-05 MED ORDER — SERTRALINE HCL 50 MG PO TABS
100.0000 mg | ORAL_TABLET | Freq: Every day | ORAL | Status: DC
  Administered 2019-04-06: 100 mg via ORAL
  Filled 2019-04-05: qty 2

## 2019-04-05 MED ORDER — DOCUSATE SODIUM 100 MG PO CAPS: 100.0000 mg | ORAL_CAPSULE | Freq: Every day | ORAL | Status: DC

## 2019-04-05 MED ORDER — ACETAMINOPHEN 325 MG PO TABS: 650.0000 mg | ORAL_TABLET | ORAL | Status: DC | PRN

## 2019-04-05 MED ORDER — PRENATAL MULTIVITAMIN CH
1.0000 | ORAL_TABLET | Freq: Every day | ORAL | Status: DC
  Administered 2019-04-06: 09:00:00 1 via ORAL
  Filled 2019-04-05: qty 1

## 2019-04-05 MED ORDER — LACTATED RINGERS IV BOLUS
1000.0000 mL | Freq: Once | INTRAVENOUS | Status: AC
  Administered 2019-04-05: 22:00:00 1000 mL via INTRAVENOUS

## 2019-04-05 MED ORDER — PRENATAL 27-0.8 MG PO TABS: 1.0000 | ORAL_TABLET | Freq: Every day | ORAL | Status: DC

## 2019-04-05 MED ORDER — ZOLPIDEM TARTRATE 5 MG PO TABS: 5.0000 mg | ORAL_TABLET | Freq: Every evening | ORAL | Status: DC | PRN

## 2019-04-05 MED ORDER — CALCIUM CARBONATE ANTACID 500 MG PO CHEW
2.0000 | CHEWABLE_TABLET | ORAL | Status: DC | PRN
  Administered 2019-04-06: 400 mg via ORAL
  Filled 2019-04-05: qty 2

## 2019-04-05 NOTE — Telephone Encounter (Signed)
Pt called the office stating she felt clear fluid squirt out today. Pt states she has been feeling crampy all day and has felt wet all day. Ad[facility] to be seen. Understanding was voiced. [name] l [name], CMA

## 2019-04-05 NOTE — MAU Note (Signed)
Pt states she felt wet all day yesterday. Pt reports that at 0630 she felt a squirt of clear fluid. Pt reports continuing to feel wet all day.   Denies vaginal bleeding.   Reports decreased fetal movement since this morning around 1100.

## 2019-04-05 NOTE — H&P (Signed)
History     CSN: [id number]  Arrival date and time: 04/05/19 1622   First Provider Initiated Contact with Patient 04/05/19 1714      Chief Complaint  Patient presents with  . Contractions  . Rupture of Membranes   HPI  Ms.  Katie Butler is a 24 y.o. year old G56P0000 female at [redacted]w[redacted]d weeks gestation who presents to MAU reporting she "felt wet all day yesterday (04/04/2019), she felt a "squirt" of fluid around 0630 today and has continued to feel wet all day today. She also complains of DFM since 1100. She denies any VB or contractions. She receives [facility]; next appt is 04/11/2019.  Past Medical History:  Diagnosis Date  . Mastitis   . Mental disorder    depression    Past Surgical History:  Procedure Laterality Date  . NO PAST SURGERIES      Family History  Problem Relation Age of Onset  . Cancer Other        breast  . Arthritis Maternal Grandmother   . Fibromyalgia Maternal Grandmother   . Scoliosis Maternal Grandmother   . Arthritis Maternal Grandfather   . Hypertension Maternal Grandfather   . Cancer Maternal Grandfather        lung, prostate  . Hyperlipidemia Maternal Grandfather   . Arthritis Paternal Grandmother   . Diabetes Paternal Grandmother   . Arthritis Paternal Grandfather   . Other Father        hypoglycemia    Social History   Tobacco Use  . Smoking status: Never Smoker  . Smokeless tobacco: Never Used  Substance Use Topics  . Alcohol use: No    Alcohol/week: 0.0 standard drinks  . Drug use: Never    Allergies: No Known Allergies  Medications Prior to Admission  Medication Sig Dispense Refill Last Dose  . Prenatal Vit-Fe Fumarate-FA (MULTIVITAMIN-PRENATAL) 27-0.8 MG TABS tablet Take 1 tablet by mouth daily at 12 noon.   04/04/2019 at Unknown time  . sertraline (ZOLOFT) 100 MG tablet Take 1 tablet (100 mg total) by mouth daily. 90 tablet 1 04/04/2019 at Unknown time    Review of Systems  Constitutional: Negative.   HENT: Negative.    Eyes: Negative.   Respiratory: Negative.   Cardiovascular: Negative.   Gastrointestinal: Negative.   Endocrine: Negative.   Genitourinary: Positive for pelvic pain (contractions; painful) and vaginal discharge.  Musculoskeletal: Negative.   Skin: Negative.   Allergic/Immunologic: Negative.   Neurological: Negative.   Hematological: Negative.   Psychiatric/Behavioral: Negative.    Physical Exam   Blood pressure 123/69, pulse 96, temperature 99 F (37.2 C), temperature source Oral, resp. rate 16, last menstrual period 07/10/2018, SpO2 100 %.  Physical Exam  Nursing note and vitals reviewed. Constitutional: She is oriented to person, place, and time. She appears well-developed and well-nourished.  HENT:  Head: Normocephalic and atraumatic.  Eyes: Pupils are equal, round, and reactive to light.  Cardiovascular: Normal rate.  Respiratory: Effort normal.  GI: Soft.  Genitourinary:    Genitourinary Comments: Uterus: gravid, S=D, SE: cervix is smooth, pink, no lesions, moderate amt of thick, white vaginal d/c; no malodor detected -- WP, GC/CT done, Ext os= 1cm, Int Os= closed/long/soft, no CMT or friability, no adnexal tenderness    Musculoskeletal:        General: Normal range of motion.     Cervical back: Normal range of motion.  Neurological: She is alert and oriented to person, place, and time.  Skin: Skin  is warm and dry.  Psychiatric: She has a normal mood and affect. Her behavior is normal. Judgment and thought content normal.   NST - FHR: 135 bpm / moderate variability / accels present / decels absent / TOCO: irregular UCs with UI noted  MAU Course  Procedures  MDM CCUA Wet Prep GC/CT -- Results pending  Amnisure Fern Slide -- Negative Reassessment @ 1930: patient still reports DFM despite reactive FHR tracing BPP  *Consult with Dr. Nehemiah Settle @ 2054 - notified of patient's complaints, assessments, lab & U/S results, recommended tx plan admit for 23 hr observation -  will enter admission orders   Results for orders placed or performed during the hospital encounter of 04/05/19 (from the past 24 hour(s))  Amnisure rupture of membrane (rom)not at [facility]     Status: None   Collection Time: 04/05/19  5:38 PM  Result Value Ref Range   Amnisure ROM NEGATIVE   Wet prep, genital     Status: Abnormal   Collection Time: 04/05/19  5:38 PM  Result Value Ref Range   Yeast Wet Prep HPF POC NONE SEEN NONE SEEN   Trich, Wet Prep NONE SEEN NONE SEEN   Clue Cells Wet Prep HPF POC NONE SEEN NONE SEEN   WBC, Wet Prep HPF POC MANY (A) NONE SEEN   Sperm NONE SEEN     Media Information       Assessment and Plan  Decreased fetal movement  - Admit to OBSCU for 23 hr observation - Routine Ante admit orders - Dr. Nehemiah Settle to enter orders - Dr. Nehemiah Settle will assume care for patient at 2055   Laury Deep, MSN, CNM 04/05/2019, 5:14 PM

## 2019-04-06 ENCOUNTER — Inpatient Hospital Stay (HOSPITAL_BASED_OUTPATIENT_CLINIC_OR_DEPARTMENT_OTHER): Payer: Medicaid Other

## 2019-04-06 DIAGNOSIS — Z3A35 35 weeks gestation of pregnancy: Secondary | ICD-10-CM

## 2019-04-06 DIAGNOSIS — O289 Unspecified abnormal findings on antenatal screening of mother: Secondary | ICD-10-CM

## 2019-04-06 DIAGNOSIS — O368130 Decreased fetal movements, third trimester, not applicable or unspecified: Secondary | ICD-10-CM

## 2019-04-06 LAB — GC/CHLAMYDIA PROBE AMP (~~LOC~~) NOT AT ARMC
Chlamydia: NEGATIVE
Comment: NEGATIVE
Comment: NORMAL
Neisseria Gonorrhea: NEGATIVE

## 2019-04-06 LAB — SARS CORONAVIRUS 2 (TAT 6-24 HRS): SARS Coronavirus 2: NEGATIVE

## 2019-04-06 LAB — OB RESULTS CONSOLE GBS: GBS: NEGATIVE

## 2019-04-06 LAB — POCT FERN TEST: POCT Fern Test: NEGATIVE

## 2019-04-06 MED ORDER — LACTATED RINGERS IV BOLUS
500.0000 mL | Freq: Once | INTRAVENOUS | Status: AC
  Administered 2019-04-06: 500 mL via INTRAVENOUS

## 2019-04-06 MED ORDER — FAMOTIDINE 20 MG PO TABS: 20.0000 mg | ORAL_TABLET | Freq: Two times a day (BID) | ORAL | 2 refills | Status: DC

## 2019-04-06 MED ORDER — FAMOTIDINE 20 MG PO TABS
20.0000 mg | ORAL_TABLET | Freq: Two times a day (BID) | ORAL | Status: DC
  Administered 2019-04-06: 20 mg via ORAL
  Filled 2019-04-06: qty 1

## 2019-04-06 NOTE — Progress Notes (Signed)
Discharge teaching complete with pt. Pt discharged home with family. BPP scheduled for tomorrow at 9:45 am.

## 2019-04-06 NOTE — Discharge Summary (Addendum)
Antenatal Physician Discharge Summary  Patient ID: Katie Butler MRN: [id number] DOB/AGE: [birth year] 22 y.o.  Admit date: 04/05/2019 Discharge date: 04/06/2019  Admission and Discharge Diagnoses:  Principal Problem:   Decreased fetal movement in third trimester Active Problems:   Supervision of normal first pregnancy   No leakage of amniotic fluid into vagina  Prenatal Procedures: NST and ultrasound/BPP  Consults: Maternal Fetal Medicine  Hospital Course:  This is a 24 y.o. G1P0000 with IUP at [redacted]w[redacted]d admitted for observation in the setting of decreased fetal movement (DFM).  Reactive FHR tracing was noted in MAU, and BPP 8/10. But given continued DFM, she was observed. Overnight, patient reports some fetal movement and FHR largely remained reactive. There were two episodes of 3 min and 5 min FHR decelerations noted overnight, with moderate variability noted during the deceleration, and accelerations flanking the decelerations.  The second episode was followed by hours of Category I FHR tracing, also repeat BPP was 10/10.  Discussed patient with Dr. Noralee Space, Maternal Fetal Medicine, he recommended repeat BPP tomorrow, no need for further continuous inpatient monitoring.  Patient was given the option to stay overnight and repeat BPP in the morning, or going home today and going to Maternal Fetal Care tomorrow for BPP, she chose the second option. BPP was scheduled for her at 9:45 am tomorrow at M[facility], she was told it was important to keep this appointment.  It was emphasized that she pay attention to fetal movements today and overnight, and to return to MAU for any concerns.  Of note, routine pelvic cultures for GBS and GC/Chlam were collected and pending at time of discharge.  She also received betamethasone x 2 doses.  She had no signs/symptoms of progressing preterm labor or other maternal-fetal concerns.  She was deemed stable for discharge to home with close outpatient follow up.  Discharge  Exam: Temp:  [98 F (36.7 C)-99 F (37.2 C)] 98 F (36.7 C) (03/03 0731) Pulse Rate:  [62-96] 73 (03/03 0731) Resp:  [16-18] 18 (03/03 0731) BP: (109-126)/(43-73) 116/59 (03/03 0731) SpO2:  [96 %-100 %] 98 % (03/03 0731) Weight:  [69.4 kg] 69.4 kg (03/02 2240) Physical Examination: CONSTITUTIONAL: Well-developed, well-nourished female in no acute distress.  NEUROLGIC: Alert and oriented to person, place, and time. Normal reflexes, muscle tone coordination. No cranial nerve deficit noted. PSYCHIATRIC: Normal mood and affect. Normal behavior. Normal judgment and thought content. CARDIOVASCULAR: Normal heart rate noted, regular rhythm RESPIRATORY: Effort and breath sounds normal, no problems with respiration noted MUSCULOSKELETAL: Normal range of motion. No edema and no tenderness. 2+ distal pulses. ABDOMEN: Soft, nontender, nondistended, gravid. CERVIX:  Deferred  Fetal monitoring: FHR: 125 bpm, Variability: moderate, Accelerations: Present, Decelerations: Absent  Uterine activity: No contractions  Significant Diagnostic Studies:  Results for orders placed or performed during the hospital encounter of 04/05/19 (from the past 168 hour(s))  Wet prep, genital   Collection Time: 04/05/19  5:38 PM  Result Value Ref Range   Yeast Wet Prep HPF POC NONE SEEN NONE SEEN   Trich, Wet Prep NONE SEEN NONE SEEN   Clue Cells Wet Prep HPF POC NONE SEEN NONE SEEN   WBC, Wet Prep HPF POC MANY (A) NONE SEEN   Sperm NONE SEEN   Amnisure rupture of membrane (rom)not at [facility]   Collection Time: 04/05/19  5:38 PM  Result Value Ref Range   Amnisure ROM NEGATIVE   Fern Test   Collection Time: 04/05/19  5:55 PM  Result Value Ref Range  POCT Fern Test Negative = intact amniotic membranes   Type and screen [place]   Collection Time: 04/05/19  9:42 PM  Result Value Ref Range   ABO/RH(D) O POS    Antibody Screen NEG    Sample Expiration      04/08/2019,2359 Performed at [facility], [place] [address] [place], [place] [address]   ABO/Rh   Collection Time: 04/05/19  9:42 PM  Result Value Ref Range   ABO/RH(D)      O POS Performed at [facility] [address], [place], [place] [address]   SARS CORONAVIRUS 2 (TAT 6-24 HRS) Nasopharyngeal Nasopharyngeal Swab   Collection Time: 04/05/19 10:19 PM   Specimen: Nasopharyngeal Swab  Result Value Ref Range   SARS Coronavirus 2 NEGATIVE NEGATIVE   No results found.  Future Appointments  Date Time Provider [place]  04/07/2019  9:45 AM [place] [facility]  04/07/2019  9:45 AM [place] [facility]  04/11/2019  2:00 PM Cottle, Bambi G, LCSW [facility] None  04/12/2019  9:10 AM Seabron Spates, CNM [facility] None  04/25/2019  2:00 PM Cottle, Bambi G, LCSW [facility] None  05/09/2019  2:00 PM Cottle, Bambi G, LCSW [facility] None  05/23/2019  2:00 PM Cottle, Bambi G, LCSW [facility] None  06/06/2019  2:00 PM Cottle, Bambi G, LCSW [facility] None  06/20/2019  2:00 PM Cottle, Bambi G, LCSW [facility] None  07/04/2019  2:00 PM Cottle, Bambi G, LCSW [facility] None    Discharge Condition: Stable  Discharge disposition: 01-Home or Self Care       Discharge Instructions    Discharge activity:  No Restrictions   Complete by: As directed    Discharge diet:  No restrictions   Complete by: As directed    Fetal Kick Count:  Lie on our left side for one hour after a meal, and count the number of times your baby kicks.  If it is less than 5 times, get up, move around and drink some juice.  Repeat the test 30 minutes later.  If it is still less than 5 kicks in an hour, notify your doctor.   Complete by: As directed    No sexual activity restrictions   Complete by: As directed    Notify physician for vaginal bleeding   Complete by: As directed    PRETERM LABOR:  Includes any of the follwing symptoms that occur between 20 - [redacted] weeks gestation.  If these symptoms are not stopped, preterm labor can result in  preterm delivery, placing your baby at risk   Complete by: As directed      Allergies as of 04/06/2019   No Known Allergies     Medication List    TAKE these medications   famotidine 20 MG tablet Commonly known as: PEPCID Take 1 tablet (20 mg total) by mouth 2 (two) times daily.   multivitamin-prenatal 27-0.8 MG Tabs tablet Take 1 tablet by mouth daily at 12 noon.   sertraline 100 MG tablet Commonly known as: Zoloft Take 1 tablet (100 mg total) by mouth daily.        Signed: Verita Schneiders M.D. 04/06/2019, 11:35 AM

## 2019-04-06 NOTE — Discharge Instructions (Signed)
Fetal Movement Counts Patient Name: ________________________________________________ Patient Due Date: ____________________ What is a fetal movement count?  A fetal movement count is the number of times that you feel your baby move during a certain amount of time. This may also be called a fetal kick count. A fetal movement count is recommended for every pregnant woman. You may be asked to start counting fetal movements as early as week 28 of your pregnancy. Pay attention to when your baby is most active. You may notice your baby's sleep and wake cycles. You may also notice things that make your baby move more. You should do a fetal movement count:  When your baby is normally most active.  At the same time each day. A good time to count movements is while you are resting, after having something to eat and drink. How do I count fetal movements? 1. Find a quiet, comfortable area. Sit, or lie down on your side. 2. Write down the date, the start time and stop time, and the number of movements that you felt between those two times. Take this information with you to your health care visits. 3. Write down your start time when you feel the first movement. 4. Count kicks, flutters, swishes, rolls, and jabs. You should feel at least 10 movements. 5. You may stop counting after you have felt 10 movements, or if you have been counting for 2 hours. Write down the stop time. 6. If you do not feel 10 movements in 2 hours, contact your health care provider for further instructions. Your health care provider may want to do additional tests to assess your baby's well-being. Contact a health care provider if:  You feel fewer than 10 movements in 2 hours.  Your baby is not moving like he or she usually does. Date: ____________ Start time: ____________ Stop time: ____________ Movements: ____________ Date: ____________ Start time: ____________ Stop time: ____________ Movements: ____________ Date: ____________  Start time: ____________ Stop time: ____________ Movements: ____________ Date: ____________ Start time: ____________ Stop time: ____________ Movements: ____________ Date: ____________ Start time: ____________ Stop time: ____________ Movements: ____________ Date: ____________ Start time: ____________ Stop time: ____________ Movements: ____________ Date: ____________ Start time: ____________ Stop time: ____________ Movements: ____________ Date: ____________ Start time: ____________ Stop time: ____________ Movements: ____________ Date: ____________ Start time: ____________ Stop time: ____________ Movements: ____________ This information is not intended to replace advice given to you by your health care provider. Make sure you discuss any questions you have with your health care provider. Document Revised: 09/09/2018 Document Reviewed: 09/09/2018 Elsevier Patient Education  2020 Else[employer] Inc[employer] Preterm Labor and Birth Information  The normal length of a pregnancy is 39-41 weeks. Preterm labor is when labor starts before 37 completed weeks of pregnancy. What are the risk factors for preterm labor? Preterm labor is more likely to occur in women who:  Have certain infections during pregnancy such as a bladder infection, sexually transmitted infection, or infection inside the uterus (chorioamnionitis).  Have a shorter-than-normal cervix.  Have gone into preterm labor before.  Have had surgery on their cervix.  Are younger than age 17 or older than age 35.  Are African American.  Are pregnant with twins or multiple babies (multiple gestation).  Take street drugs or smoke while pregnant.  Do not gain enough weight while pregnant.  Became pregnant shortly after having been pregnant. What are the symptoms of preterm labor? Symptoms of preterm labor include:  Cramps similar to those that can happen during a menstrual period. The   cramps may happen with diarrhea.  Pain in the abdomen or lower  back.  Regular uterine contractions that may feel like tightening of the abdomen.  A feeling of increased pressure in the pelvis.  Increased watery or bloody mucus discharge from the vagina.  Water breaking (ruptured amniotic sac). Why is it important to recognize signs of preterm labor? It is important to recognize signs of preterm labor because babies who are born prematurely may not be fully developed. This can put them at an increased risk for:  Long-term (chronic) heart and lung problems.  Difficulty immediately after birth with regulating body systems, including blood sugar, body temperature, heart rate, and breathing rate.  Bleeding in the brain.  Cerebral palsy.  Learning difficulties.  Death. These risks are highest for babies who are born before 34 weeks of pregnancy. How is preterm labor treated? Treatment depends on the length of your pregnancy, your condition, and the health of your baby. It may involve:  Having a stitch (suture) placed in your cervix to prevent your cervix from opening too early (cerclage).  Taking or being given medicines, such as: ? Hormone medicines. These may be given early in pregnancy to help support the pregnancy. ? Medicine to stop contractions. ? Medicines to help mature the baby's lungs. These may be prescribed if the risk of delivery is high. ? Medicines to prevent your baby from developing cerebral palsy. If the labor happens before 34 weeks of pregnancy, you may need to stay in the hospital. What should I do if I think I am in preterm labor? If you think that you are going into preterm labor, call your health care provider right away. How can I prevent preterm labor in future pregnancies? To increase your chance of having a full-term pregnancy:  Do not use any tobacco products, such as cigarettes, chewing tobacco, and e-cigarettes. If you need help quitting, ask your health care provider.  Do not use street drugs or medicines that  have not been prescribed to you during your pregnancy.  Talk with your health care provider before taking any herbal supplements, even if you have been taking them regularly.  Make sure you gain a healthy amount of weight during your pregnancy.  Watch for infection. If you think that you might have an infection, get it checked right away.  Make sure to tell your health care provider if you have gone into preterm labor before. This information is not intended to replace advice given to you by your health care provider. Make sure you discuss any questions you have with your health care provider. Document Revised: 05/14/2018 Document Reviewed: 06/13/2015 Elsevier Patient Education  2020 Elsevier Inc.  

## 2019-04-06 NOTE — Progress Notes (Signed)
At 0505, pt up to b/r. While pt in b/r @0507 , questionable tracing of maternal HR at 75 versus FHR. Pt back to bed and to left side. RN at bedside searching with EFM. FHR at 0509 of 80. Pt to right side, EFM adjusted/searching. At 0510 FHR 75. RN called out for charge RN. into room. At 0511, FHR 100. Dr. Consulting civil engineer called and notified of prolonged decel. Orders received. FHR gradually up to 120 at 0514.

## 2019-04-06 NOTE — Progress Notes (Signed)
Patient ID: Katie Butler, female   DOB: [birth year], 24 y.o.   MRN: [id number]  FACULTY PRACTICE ANTEPARTUM NOTE  Katie Butler is a 24 y.o. G1P0000 at 100w5d  who is admitted for decreased fetal movement.   Fetal presentation is cephalic. Length of Stay:  1  Days  Subjective: Had a couple episodes of prolonged decels. Responsive to position changes and IVF bolus. Last decel 2hrs ago. Movement has increased some.  Patient reports good fetal movement.   She reports irregular uterine contractions She reports no bleeding  She reports no loss of fluid per vagina.  Vitals:  Blood pressure (!) 109/43, pulse 71, temperature 98.3 F (36.8 C), temperature source Oral, resp. rate 17, height 5\' 7"  (1.702 m), weight 69.4 kg, last menstrual period 07/10/2018, SpO2 98 %. Physical Examination:  General appearance - alert, well appearing, and in no distress Mental status - alert, oriented to person, place, and time Chest - no respiratory distress Heart - regular rate Abdomen - soft, nontender, nondistended, no masses or organomegaly Fundal Height:  size equals dates Extremities: extremities normal, atraumatic, no cyanosis or edema Membranes:intact  Fetal Monitoring:  Baseline: 110-120 bpm, Variability: Good {> 6 bpm), Accelerations: multiple and Decelerations: a couple prolonged decels. currently, no decels.  Labs:  Results for orders placed or performed during the hospital encounter of 04/05/19 (from the past 24 hour(s))  Wet prep, genital   Collection Time: 04/05/19  5:38 PM  Result Value Ref Range   Yeast Wet Prep HPF POC NONE SEEN NONE SEEN   Trich, Wet Prep NONE SEEN NONE SEEN   Clue Cells Wet Prep HPF POC NONE SEEN NONE SEEN   WBC, Wet Prep HPF POC MANY (A) NONE SEEN   Sperm NONE SEEN   Amnisure rupture of membrane (rom)not at [facility]   Collection Time: 04/05/19  5:38 PM  Result Value Ref Range   Amnisure ROM NEGATIVE   Fern Test   Collection Time: 04/05/19  5:55 PM  Result Value Ref  Range   POCT Fern Test Negative = intact amniotic membranes   Type and screen [facility]   Collection Time: 04/05/19  9:42 PM  Result Value Ref Range   ABO/RH(D) O POS    Antibody Screen NEG    Sample Expiration      04/08/2019,2359 Performed at [facility], [address] [place], [place]   ABO/Rh   Collection Time: 04/05/19  9:42 PM  Result Value Ref Range   ABO/RH(D)      O POS Performed at [facility], [address], [place], [place]   SARS CORONAVIRUS 2 (TAT 6-24 HRS) Nasopharyngeal Nasopharyngeal Swab   Collection Time: 04/05/19 10:19 PM   Specimen: Nasopharyngeal Swab  Result Value Ref Range   SARS Coronavirus 2 NEGATIVE NEGATIVE    Imaging Studies:      Medications:  Scheduled . betamethasone acetate-betamethasone sodium phosphate  12 mg Intramuscular Q24H  . docusate sodium  100 mg Oral Daily  . prenatal multivitamin  1 tablet Oral Q1200  . sertraline  100 mg Oral QHS   I have reviewed the patient's current medications.  ASSESSMENT: Active Problems:   No leakage of amniotic fluid into vagina   Decreased fetal movement   PLAN: Will continue to monitor baby for now. May need to induce if continues to have decelerations. Continue routine antenatal care.   06/05/19, DO 04/06/2019,7:28 AM

## 2019-04-07 ENCOUNTER — Encounter (HOSPITAL_COMMUNITY): Payer: Self-pay

## 2019-04-07 ENCOUNTER — Other Ambulatory Visit (HOSPITAL_COMMUNITY): Payer: Self-pay | Admitting: Obstetrics & Gynecology

## 2019-04-07 ENCOUNTER — Ambulatory Visit (HOSPITAL_COMMUNITY): Payer: Medicaid Other | Admitting: *Deleted

## 2019-04-07 ENCOUNTER — Ambulatory Visit (HOSPITAL_BASED_OUTPATIENT_CLINIC_OR_DEPARTMENT_OTHER)
Admit: 2019-04-07 | Discharge: 2019-04-07 | Disposition: A | Discharge status: Home / Self Care | Payer: Medicaid Other | Attending: Obstetrics & Gynecology | Admitting: Obstetrics & Gynecology

## 2019-04-07 ENCOUNTER — Other Ambulatory Visit: Payer: Self-pay

## 2019-04-07 ENCOUNTER — Other Ambulatory Visit (HOSPITAL_COMMUNITY): Payer: Self-pay | Admitting: *Deleted

## 2019-04-07 VITALS — BP 123/74 | HR 96 | Temp 98.1°F

## 2019-04-07 DIAGNOSIS — O368190 Decreased fetal movements, unspecified trimester, not applicable or unspecified: Secondary | ICD-10-CM

## 2019-04-07 DIAGNOSIS — Z3A35 35 weeks gestation of pregnancy: Secondary | ICD-10-CM

## 2019-04-07 DIAGNOSIS — O368130 Decreased fetal movements, third trimester, not applicable or unspecified: Secondary | ICD-10-CM

## 2019-04-07 NOTE — Procedures (Signed)
Katie Butler 06-15-1995 [redacted]w[redacted]d  Fetus A Non-Stress Test Interpretation for 04/07/19  Indication: Decreased Fetal Movement  Fetal Heart Rate A Mode: External Baseline Rate (A): 145 bpm Variability: Moderate Accelerations: 15 x 15 Decelerations: None Multiple birth?: No  Uterine Activity Mode: Palpation, Toco Contraction Frequency (min): x3 Contraction Duration (sec): 70-80 Contraction Quality: Mild Resting Tone Palpated: Relaxed Resting Time: Adequate  Interpretation (Fetal Testing) Nonstress Test Interpretation: Reactive Comments: Reviewed tracing with Dr. Judeth Cornfield

## 2019-04-08 ENCOUNTER — Telehealth (HOSPITAL_COMMUNITY): Payer: Self-pay | Admitting: *Deleted

## 2019-04-08 LAB — CULTURE, BETA STREP (GROUP B ONLY)

## 2019-04-08 NOTE — Telephone Encounter (Signed)
Preadmission screen  

## 2019-04-11 ENCOUNTER — Telehealth (HOSPITAL_COMMUNITY): Payer: Self-pay | Admitting: *Deleted

## 2019-04-11 ENCOUNTER — Encounter (HOSPITAL_COMMUNITY): Payer: Self-pay | Admitting: *Deleted

## 2019-04-11 ENCOUNTER — Ambulatory Visit (INDEPENDENT_AMBULATORY_CARE_PROVIDER_SITE_OTHER): Payer: Medicaid Other | Admitting: Psychology

## 2019-04-11 ENCOUNTER — Other Ambulatory Visit: Payer: Self-pay | Admitting: Advanced Practice Midwife

## 2019-04-11 DIAGNOSIS — F4310 Post-traumatic stress disorder, unspecified: Secondary | ICD-10-CM | POA: Diagnosis not present

## 2019-04-11 NOTE — Telephone Encounter (Signed)
Preadmission screen  

## 2019-04-12 ENCOUNTER — Encounter: Payer: Self-pay | Admitting: Advanced Practice Midwife

## 2019-04-12 ENCOUNTER — Other Ambulatory Visit: Payer: Self-pay

## 2019-04-12 ENCOUNTER — Ambulatory Visit (INDEPENDENT_AMBULATORY_CARE_PROVIDER_SITE_OTHER): Payer: Medicaid Other | Admitting: Advanced Practice Midwife

## 2019-04-12 VITALS — BP 113/56 | HR 70 | Wt 156.0 lb

## 2019-04-12 DIAGNOSIS — Z3403 Encounter for supervision of normal first pregnancy, third trimester: Secondary | ICD-10-CM

## 2019-04-12 DIAGNOSIS — O368130 Decreased fetal movements, third trimester, not applicable or unspecified: Secondary | ICD-10-CM

## 2019-04-12 DIAGNOSIS — Z3A36 36 weeks gestation of pregnancy: Secondary | ICD-10-CM

## 2019-04-12 NOTE — Progress Notes (Signed)
   PRENATAL VISIT NOTE  Subjective:  Katie Butler is a 24 y.o. G1P0000 at [redacted]w[redacted]d being seen today for ongoing prenatal care.  She is currently monitored for the following issues for this low-risk pregnancy and has Proteinuria; Supervision of normal first pregnancy; Depression affecting pregnancy, antepartum; Rubella non-immune status, antepartum; Cystic fibrosis carrier; No leakage of amniotic fluid into vagina; Decreased fetal movement in third trimester; and Disordered eating on their problem list.  Patient reports no complaints.  Contractions: Irregular. Vag. Bleeding: None.  Movement: Present. Denies leaking of fluid.   The following portions of the patient's history were reviewed and updated as appropriate: allergies, current medications, past family history, past medical history, past social history, past surgical history and problem list.   Was seen on 04/07/19 for decreased fetal movement.   Was told she would be induced at 37 weeks "because of my BPP and decreased movement.  BPP score was 10/10 but Dr Judeth Cornfield stated that if the decreased FM persisted she could be induced at 37wks.   Objective:   Vitals:   04/12/19 0912  BP: (!) 113/56  Pulse: 70  Weight: 156 lb (70.8 kg)    Fetal Status: Fetal Heart Rate (bpm): 140   Movement: Present     General:  Alert, oriented and cooperative. Patient is in no acute distress.  Skin: Skin is warm and dry. No rash noted.   Cardiovascular: Normal heart rate noted  Respiratory: Normal respiratory effort, no problems with respiration noted  Abdomen: Soft, gravid, appropriate for gestational age.  Pain/Pressure: Present     Pelvic: Cervical exam deferred        Extremities: Normal range of motion.  Edema: None  Mental Status: Normal mood and affect. Normal behavior. Normal judgment and thought content.   Assessment and Plan:  Pregnancy: G1P0000 at [redacted]w[redacted]d . Preterm labor symptoms and general obstetric precautions including but not limited to  vaginal bleeding, contractions, leaking of fluid and fetal movement were reviewed in detail with the patient. Please refer to After Visit Summary for other counseling recommendations.   Scheduled for IOL Friday Wants CNM care Will see her 4 weeks postpartum  Future Appointments  Date Time Provider Department Center  04/13/2019  1:15 PM MC-SCREENING MC-SDSC None  04/13/2019  2:00 PM WH-MFC NURSE WH-MFC MFC-US  04/13/2019  2:00 PM WH-MFC [place] 3 WH-MFCUS MFC-US  04/13/2019  3:15 PM WH-MFC NST WH-MFC MFC-US  04/15/2019  7:30 AM MC-LD SCHED ROOM MC-INDC None  04/25/2019  2:00 PM Cottle, Bambi G, LCSW [facility] None  05/09/2019  2:00 PM Cottle, Bambi G, LCSW [facility] None  05/19/2019 10:45 AM Levie Heritage, DO [facility] None  05/23/2019  2:00 PM Cottle, Bambi G, LCSW [facility] None  06/06/2019  2:00 PM Cottle, Bambi G, LCSW [facility] None  06/20/2019  2:00 PM Cottle, Bambi G, LCSW [facility] None  07/04/2019  2:00 PM Cottle, Bambi G, LCSW [facility] None    Wynelle Bourgeois, CNM

## 2019-04-12 NOTE — Patient Instructions (Signed)

## 2019-04-13 ENCOUNTER — Encounter (HOSPITAL_COMMUNITY): Payer: Self-pay

## 2019-04-13 ENCOUNTER — Other Ambulatory Visit (HOSPITAL_COMMUNITY): Payer: Self-pay | Admitting: Obstetrics and Gynecology

## 2019-04-13 ENCOUNTER — Ambulatory Visit (HOSPITAL_COMMUNITY)
Admission: RE | Admit: 2019-04-13 | Discharge: 2019-04-13 | Disposition: A | Discharge status: Home / Self Care | Payer: Medicaid Other | Source: Ambulatory Visit | Attending: Obstetrics and Gynecology | Admitting: Obstetrics and Gynecology

## 2019-04-13 ENCOUNTER — Ambulatory Visit (HOSPITAL_COMMUNITY): Payer: Medicaid Other | Admitting: *Deleted

## 2019-04-13 ENCOUNTER — Other Ambulatory Visit (HOSPITAL_COMMUNITY)
Admission: RE | Admit: 2019-04-13 | Discharge: 2019-04-13 | Disposition: A | Discharge status: Home / Self Care | Payer: Medicaid Other | Source: Ambulatory Visit | Attending: Family Medicine | Admitting: Family Medicine

## 2019-04-13 VITALS — BP 135/75 | HR 87 | Temp 97.6°F

## 2019-04-13 DIAGNOSIS — Z362 Encounter for other antenatal screening follow-up: Secondary | ICD-10-CM | POA: Diagnosis not present

## 2019-04-13 DIAGNOSIS — O368190 Decreased fetal movements, unspecified trimester, not applicable or unspecified: Secondary | ICD-10-CM

## 2019-04-13 DIAGNOSIS — Z20822 Contact with and (suspected) exposure to covid-19: Secondary | ICD-10-CM | POA: Diagnosis not present

## 2019-04-13 DIAGNOSIS — O09893 Supervision of other high risk pregnancies, third trimester: Secondary | ICD-10-CM

## 2019-04-13 DIAGNOSIS — O358XX0 Maternal care for other (suspected) fetal abnormality and damage, not applicable or unspecified: Secondary | ICD-10-CM

## 2019-04-13 DIAGNOSIS — Z3A36 36 weeks gestation of pregnancy: Secondary | ICD-10-CM

## 2019-04-13 DIAGNOSIS — O368130 Decreased fetal movements, third trimester, not applicable or unspecified: Secondary | ICD-10-CM

## 2019-04-13 LAB — SARS CORONAVIRUS 2 (TAT 6-24 HRS): SARS Coronavirus 2: NEGATIVE

## 2019-04-13 NOTE — Procedures (Signed)
Katie Butler 1995/09/09 [redacted]w[redacted]d  Fetus A Non-Stress Test Interpretation for 04/13/19  Indication: Decreased Fetal Movement  Fetal Heart Rate A Mode: External Baseline Rate (A): 140 bpm Variability: Moderate Accelerations: 15 x 15 Decelerations: None  Uterine Activity Mode: Toco Contraction Frequency (min): one UC noted Contraction Duration (sec): 150 Contraction Quality: Mild Resting Tone Palpated: Relaxed Resting Time: Adequate  Interpretation (Fetal Testing) Nonstress Test Interpretation: Reactive Comments: FHR tracing rev'd by Dr. Parke Poisson

## 2019-04-15 ENCOUNTER — Other Ambulatory Visit: Payer: Self-pay

## 2019-04-15 ENCOUNTER — Inpatient Hospital Stay (HOSPITAL_COMMUNITY): Payer: Medicaid Other

## 2019-04-15 ENCOUNTER — Inpatient Hospital Stay (HOSPITAL_COMMUNITY)
Admission: AD | Admit: 2019-04-15 | Discharge: 2019-04-18 | DRG: 807 | Disposition: A | Discharge status: Home / Self Care | Payer: Medicaid Other | Attending: Obstetrics and Gynecology | Admitting: Obstetrics and Gynecology

## 2019-04-15 ENCOUNTER — Encounter (HOSPITAL_COMMUNITY): Payer: Self-pay | Admitting: Obstetrics & Gynecology

## 2019-04-15 DIAGNOSIS — Z3A37 37 weeks gestation of pregnancy: Secondary | ICD-10-CM

## 2019-04-15 DIAGNOSIS — O99344 Other mental disorders complicating childbirth: Secondary | ICD-10-CM | POA: Diagnosis present

## 2019-04-15 DIAGNOSIS — Z141 Cystic fibrosis carrier: Secondary | ICD-10-CM

## 2019-04-15 DIAGNOSIS — O368130 Decreased fetal movements, third trimester, not applicable or unspecified: Secondary | ICD-10-CM | POA: Diagnosis present

## 2019-04-15 DIAGNOSIS — F329 Major depressive disorder, single episode, unspecified: Secondary | ICD-10-CM | POA: Diagnosis present

## 2019-04-15 DIAGNOSIS — Z30430 Encounter for insertion of intrauterine contraceptive device: Secondary | ICD-10-CM

## 2019-04-15 LAB — TYPE AND SCREEN
ABO/RH(D): O POS
Antibody Screen: NEGATIVE

## 2019-04-15 LAB — CBC
HCT: 34.8 % — ABNORMAL LOW (ref 36.0–46.0)
Hemoglobin: 11.2 g/dL — ABNORMAL LOW (ref 12.0–15.0)
MCH: 30.8 pg (ref 26.0–34.0)
MCHC: 32.2 g/dL (ref 30.0–36.0)
MCV: 95.6 fL (ref 80.0–100.0)
Platelets: 287 10*3/uL (ref 150–400)
RBC: 3.64 MIL/uL — ABNORMAL LOW (ref 3.87–5.11)
RDW: 13.3 % (ref 11.5–15.5)
WBC: 12.6 10*3/uL — ABNORMAL HIGH (ref 4.0–10.5)
nRBC: 0 % (ref 0.0–0.2)

## 2019-04-15 LAB — SYPHILIS: RPR W/REFLEX TO RPR TITER AND TREPONEMAL ANTIBODIES, TRADITIONAL SCREENING AND DIAGNOSIS ALGORITHM: RPR Ser Ql: NONREACTIVE

## 2019-04-15 MED ORDER — OXYCODONE-ACETAMINOPHEN 5-325 MG PO TABS: 2.0000 | ORAL_TABLET | ORAL | Status: DC | PRN

## 2019-04-15 MED ORDER — ONDANSETRON HCL 4 MG/2ML IJ SOLN: 4.0000 mg | Freq: Four times a day (QID) | INTRAMUSCULAR | Status: DC | PRN

## 2019-04-15 MED ORDER — OXYTOCIN BOLUS FROM INFUSION
500.0000 mL | Freq: Once | INTRAVENOUS | Status: AC
  Administered 2019-04-16: 500 mL via INTRAVENOUS

## 2019-04-15 MED ORDER — LACTATED RINGERS IV SOLN: INTRAVENOUS | Status: DC

## 2019-04-15 MED ORDER — ACETAMINOPHEN 325 MG PO TABS: 650.0000 mg | ORAL_TABLET | ORAL | Status: DC | PRN

## 2019-04-15 MED ORDER — LEVONORGESTREL 19.5 MCG/DAY IU IUD
INTRAUTERINE_SYSTEM | Freq: Once | INTRAUTERINE | Status: AC
  Administered 2019-04-16: 1 via INTRAUTERINE
  Filled 2019-04-15: qty 1

## 2019-04-15 MED ORDER — FENTANYL CITRATE (PF) 100 MCG/2ML IJ SOLN
100.0000 ug | INTRAMUSCULAR | Status: DC | PRN
  Administered 2019-04-16 (×4): 100 ug via INTRAVENOUS
  Filled 2019-04-15 (×4): qty 2

## 2019-04-15 MED ORDER — SOD CITRATE-CITRIC ACID 500-334 MG/5ML PO SOLN: 30.0000 mL | ORAL | Status: DC | PRN

## 2019-04-15 MED ORDER — LIDOCAINE HCL (PF) 1 % IJ SOLN
30.0000 mL | INTRAMUSCULAR | Status: AC | PRN
  Administered 2019-04-16: 30 mL via SUBCUTANEOUS
  Filled 2019-04-15: qty 30

## 2019-04-15 MED ORDER — MISOPROSTOL 25 MCG QUARTER TABLET
25.0000 ug | ORAL_TABLET | ORAL | Status: DC | PRN
  Administered 2019-04-15 (×2): 25 ug via VAGINAL
  Filled 2019-04-15 (×2): qty 1

## 2019-04-15 MED ORDER — OXYTOCIN 40 UNITS IN NORMAL SALINE INFUSION - SIMPLE MED
1.0000 m[IU]/min | INTRAVENOUS | Status: DC
  Administered 2019-04-15 – 2019-04-16 (×2): 2 m[IU]/min via INTRAVENOUS

## 2019-04-15 MED ORDER — OXYCODONE-ACETAMINOPHEN 5-325 MG PO TABS: 1.0000 | ORAL_TABLET | ORAL | Status: DC | PRN

## 2019-04-15 MED ORDER — TERBUTALINE SULFATE 1 MG/ML IJ SOLN: 0.2500 mg | Freq: Once | INTRAMUSCULAR | Status: DC | PRN

## 2019-04-15 MED ORDER — OXYTOCIN 40 UNITS IN NORMAL SALINE INFUSION - SIMPLE MED
2.5000 [IU]/h | INTRAVENOUS | Status: DC
  Administered 2019-04-16: 2.5 [IU]/h via INTRAVENOUS
  Filled 2019-04-15: qty 1000

## 2019-04-15 MED ORDER — LACTATED RINGERS IV SOLN
500.0000 mL | INTRAVENOUS | Status: DC | PRN
  Administered 2019-04-15: 500 mL via INTRAVENOUS

## 2019-04-15 NOTE — Progress Notes (Signed)
LABOR PROGRESS NOTE  Katie Butler is a 24 y.o. G1P0000 at [redacted]w[redacted]d  admitted for IOL for decreased fetal movement.  Subjective: Patient not really feeling contractions at this time.   Objective: BP 120/77   Pulse 66   Temp 98.2 F (36.8 C) (Oral)   Resp 16   Ht 5\' 7"  (1.702 m)   Wt 72.2 kg   LMP 07/10/2018 (LMP Unknown)   BMI 24.92 kg/m  or  Vitals:   04/15/19 1338 04/15/19 1532 04/15/19 1630 04/15/19 1835  BP: 122/68 (!) 107/46 118/73 120/77  Pulse: 71 70 62 66  Resp: 16 17 14 16   Temp:   98.7 F (37.1 C) 98.2 F (36.8 C)  TempSrc:   Oral Oral  Weight:      Height:         Dilation: 2.5 Effacement (%): 80 Station: -1 Presentation: Vertex Exam by:: J Mbugua RN FHT: baseline rate 140, moderate varibility, positive acel, some variable decels Toco: q2-3 min  Labs: Lab Results  Component Value Date   WBC 12.6 (H) 04/15/2019   HGB 11.2 (L) 04/15/2019   HCT 34.8 (L) 04/15/2019   MCV 95.6 04/15/2019   PLT 287 04/15/2019    Patient Active Problem List   Diagnosis Date Noted  . Labor and delivery, indication for care 04/15/2019  . No leakage of amniotic fluid into vagina 04/05/2019  . Decreased fetal movement in third trimester 04/05/2019  . 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 Rubella non-immune status, antepartum 10/01/2018  . Supervision of normal first pregnancy 09/30/2018  . Depression affecting pregnancy, antepartum 09/30/2018  . Disordered eating 11/22/2014  . Proteinuria 06/08/2012    Assessment / Plan: 24 y.o. G1P0000 at [redacted]w[redacted]d here for IOL for decreased fetal movements.  Labor: s/p cytotec x2. Last one at 1341. Start pitocin now. Consider AROM as indicated. Will recheck in 4 hours.  Fetal Wellbeing:  Cat 2 but good variability Pain Control:  Patient would like to "be as natural as possible" GBS: negative Anticipated MOD:  VD  36, MD OB Resident 04/15/2019, 8:44 PM

## 2019-04-15 NOTE — H&P (Addendum)
OBSTETRIC ADMISSION HISTORY AND PHYSICAL  Katie Butler is a 24 y.o. female G1P0000 with IUP at [redacted]w[redacted]d by 1st trimester u/s presenting for IOL for decreased fetal movements. She reports +FMs, No LOF, no VB, no blurry vision, headaches or peripheral edema, and RUQ pain.  She plans on breast feeding. She request IUD for birth control. She received her prenatal care at [facility]   Dating: By 1st trimester u/s --->  Estimated Date of Delivery: 05/06/19  Sono:    @[redacted]w[redacted]d , CWD, normal anatomy, cephalic presentation, 3220g, EFW   Prenatal History/Complications: Depression affecting pregnancy CF carrier Decreased fetal movements in 3rd trimester  Past Medical History: Past Medical History:  Diagnosis Date  . Mastitis   . Mental disorder    depression    Past Surgical History: Past Surgical History:  Procedure Laterality Date  . NO PAST SURGERIES      Obstetrical History: OB History    Gravida  1   Para  0   Term  0   Preterm  0   AB  0   Living  0     SAB  0   TAB  0   Ectopic  0   Multiple  0   Live Births  0           Social History Social History   Socioeconomic History  . Marital status: Single    Spouse name: Not on file  . Number of children: Not on file  . Years of education: Not on file  . Highest education level: Not on file  Occupational History  . Not on file  Tobacco Use  . Smoking status: Never Smoker  . Smokeless tobacco: Never Used  Substance and Sexual Activity  . Alcohol use: No    Alcohol/week: 0.0 standard drinks  . Drug use: Never  . Sexual activity: Not Currently    Partners: Female  Other Topics Concern  . Not on file  Social History Narrative  . Not on file   Social Determinants of Health   Financial Resource Strain:   . Difficulty of Paying Living Expenses:   Food Insecurity:   . Worried About 57% in the Last Year:   . Programme researcher, broadcasting/film/video in the Last Year:   Transportation Needs:   . Barista (Medical):   Automotive engineer Lack of Transportation (Non-Medical):   Physical Activity:   . Days of Exercise per Week:   . Minutes of Exercise per Session:   Stress:   . Feeling of Stress :   Social Connections:   . Frequency of Communication with Friends and Family:   . Frequency of Social Gatherings with Friends and Family:   . Attends Religious Services:   . Active Member of Clubs or Organizations:   . Attends [employer] Meetings:   Banker Marital Status:     Family History: Family History  Problem Relation Age of Onset  . Cancer Other        breast  . Arthritis Maternal Grandmother   . Fibromyalgia Maternal Grandmother   . Scoliosis Maternal Grandmother   . Arthritis Maternal Grandfather   . Hypertension Maternal Grandfather   . Cancer Maternal Grandfather        lung, prostate  . Hyperlipidemia Maternal Grandfather   . Arthritis Paternal Grandmother   . Diabetes Paternal Grandmother   . Other Paternal Grandmother   . Arthritis Paternal Grandfather     Allergies: No  Known Allergies  Medications Prior to Admission  Medication Sig Dispense Refill Last Dose  . famotidine (PEPCID) 20 MG tablet Take 1 tablet (20 mg total) by mouth 2 (two) times daily. (Patient not taking: Reported on 04/12/2019) 60 tablet 2   . Prenatal Vit-Fe Fumarate-FA (MULTIVITAMIN-PRENATAL) 27-0.8 MG TABS tablet Take 1 tablet by mouth daily at 12 noon.     . sertraline (ZOLOFT) 100 MG tablet Take 1 tablet (100 mg total) by mouth daily. 90 tablet 1      Review of Systems   All systems reviewed and negative except as stated in HPI  Blood pressure 121/69, pulse 77, temperature 98.4 F (36.9 C), temperature source Oral, resp. rate 16, height 5\' 7"  (1.702 m), weight 72.2 kg, last menstrual period 07/10/2018. General appearance: alert, cooperative and no distress Lungs: clear to auscultation bilaterally Heart: regular rate and rhythm Abdomen: soft, non-tender; bowel sounds normal Pelvic:  Deferred Extremities: Homans sign is negative, no sign of DVT Presentation: cephalic Fetal monitoringBaseline: 140 bpm, Variability: Good {> 6 bpm) and Accelerations: Reactive Uterine activity No contractions Dilation: 1.5 Effacement (%): Thick Station: -3 Exam by:: August Albino RNC   Prenatal labs: ABO, Rh: --/--/O POS, O POS Performed at [facility], [place] [address] [place], [place] [address]  [phone]) Antibody: NEG (03/02 2142) Rubella: <0.90 (08/27 1055) RPR: Non Reactive (01/06 0915)  HBsAg: Negative (08/27 1055)  HIV: Non Reactive (01/06 0915)  GBS: Negative/-- (03/03 0000)  2 hr Glucola wnl Genetic screening  Panorama normal Anatomy US: Echogenic focus in LV.  Prenatal Transfer Tool  Maternal Diabetes: No Genetic Screening: Normal panorama, CF carrier Maternal Ultrasounds/Referrals: Other: Echogenic focus in LV. Fetal Ultrasounds or other Referrals:  None Maternal Substance Abuse:  No Significant Maternal Medications:  Meds include: Zoloft Significant Maternal Lab Results: Group B Strep negative  No results found for this or any previous visit (from the past 24 hour(s)).  Patient Active Problem List   Diagnosis Date Noted  . Labor and delivery, indication for care 04/15/2019  . No leakage of amniotic fluid into vagina 04/05/2019  . Decreased fetal movement in third trimester 04/05/2019  . 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 Rubella non-immune status, antepartum 10/01/2018  . Supervision of normal first pregnancy 09/30/2018  . Depression affecting pregnancy, antepartum 09/30/2018  . Disordered eating 11/22/2014  . Proteinuria 06/08/2012    Assessment/Plan:  Katie Butler is a 24 y.o. G1P0000 at [redacted]w[redacted]d here for IOL for decreased fetal movements.  #Labor:Will start with cytotec, can consider FB or pitocin once labor progresses as indicated. #Pain: Patient still unsure but would like to "be as natural as possible" #FWB: Cat I #ID: GBS  negative #MOF: Breast #MOC:IUD #Circ:  Yes outpatient  Lurline Del, DO  04/15/2019, 9:42 AM   I confirm that I have verified the information documented in the resident's note and that I have also personally reperformed the history, physical exam and all medical decision making activities of this service and have verified that all service and findings are accurately documented in this student's note.   PE: General: No distress Chest: RRR, Lungs CTA-B Abd: Soft, NT, Gravid: 7lbs 2oz, Vertex by [place] [employer] Ext: No apparent Edema  -Discussed r/b of induction including fetal distress, serial induction, pain, and increased risk of c/s delivery -Discussed induction methods including cervical ripening agents, foley bulbs, and pitocin -Patient verbalizes understanding and wishes to proceed with induction process. -Patient confirms desire for CNM management and delivery. -Informed of  fellows and resident's assistance in care, but that CNM will perform delivery if available.  -Nurse instructed to start with cytotec and will reassess as needed.  Gerrit Heck, CNM 04/15/2019 1:01 PM

## 2019-04-15 NOTE — Progress Notes (Signed)
Katie Butler MRN: [id number]  Subjective: -Patient eating [place] burger and Mac and cheese upon provider arrival. Nurse at bedside. Patient reports pelvic discomfort, but is coping well.  Mother remains at bedside.    Objective: BP 118/73   Pulse 62   Temp 98.7 F (37.1 C) (Oral)   Resp 14   Ht _0  (1.702 m)   Wt 72.2 kg   LMP 07/10/2018 (LMP Unknown)   BMI 24.92 kg/m  No intake/output data recorded. No intake/output data recorded.  Fetal Monitoring: FHT: 135 bpm, Mod Var, -Decels, +Accels UC: Irritability Noted, Palpates Moderate, Soft RT    Vaginal Exam: SVE:   Dilation: 1.5 Effacement (%): 70 Station: -1 Exam by:: Katie Felling, RN Membranes:Intact Internal Monitors: None  Augmentation/Induction: Pitocin:Held Cytotec: S/P 2 Doses  Assessment:  IUP at 37 weeks Cat I FT  IOL  Plan: -Nurse reports patient had regular diet ordered in original admit orders. -Patient becomes tearful stating that she didn't want to get in trouble for eating. -Patient comforted, reassured, and informed that diet during labor is modified in case of emergent situations that would require intubation.  However, because this is an induction we are in no rush and will just hold the pitocin for 2 hours and then start it then. -Patient encouraged to finish her meal.  -Patient appeared comforted and had no further questions or concerns.  -Will recheck and start pitocin in 2 hours. -Continue other mgmt as ordered.    Katie Butler, CNM Advanced Practice Provider, [facility] 04/15/2019, 5:30 PM

## 2019-04-15 NOTE — Progress Notes (Signed)
Katie Butler MRN: [id number]  Subjective: -Patient resting in bed.  Reports fetal movement, but denies abdominal cramping or contractions.   Objective: BP 128/67   Pulse 69   Temp 98.4 F (36.9 C) (Oral)   Resp 16   Ht 5\' 7"  (1.702 m)   Wt 72.2 kg   LMP 07/10/2018 (LMP Unknown)   BMI 24.92 kg/m  No intake/output data recorded. No intake/output data recorded.  Fetal Monitoring: FHT: 120 bpm, Mod Var, -Decels, +Accels UC: Irritability noted    Vaginal Exam: SVE:   Dilation: 1.5 Effacement (%): Thick Station: -3 Exam by:: [email or web address] RNC Membranes:Intact Internal Monitors: None  Augmentation/Induction: Pitocin:None Cytotec: S/P 1 Dose, Next Dose at 1330  Assessment:  IUP at 37 weeks Cat I FT  IOL s/t DFM   Plan: -Informed next dose of cytotec at 1330. -Will continue to monitor -Continue other mgmt as ordered   Ashley Mariner, CNM Advanced Practice Provider, [facility] 04/15/2019, 1:14 PM

## 2019-04-16 ENCOUNTER — Encounter (HOSPITAL_COMMUNITY): Payer: Self-pay | Admitting: Obstetrics & Gynecology

## 2019-04-16 DIAGNOSIS — O368130 Decreased fetal movements, third trimester, not applicable or unspecified: Secondary | ICD-10-CM

## 2019-04-16 DIAGNOSIS — Z3A37 37 weeks gestation of pregnancy: Secondary | ICD-10-CM

## 2019-04-16 MED ORDER — PRENATAL MULTIVITAMIN CH
1.0000 | ORAL_TABLET | Freq: Every day | ORAL | Status: DC
  Administered 2019-04-16 – 2019-04-18 (×3): 1 via ORAL
  Filled 2019-04-16 (×3): qty 1

## 2019-04-16 MED ORDER — ACETAMINOPHEN 325 MG PO TABS: 650.0000 mg | ORAL_TABLET | ORAL | Status: DC | PRN

## 2019-04-16 MED ORDER — SERTRALINE HCL 100 MG PO TABS
100.0000 mg | ORAL_TABLET | Freq: Every day | ORAL | Status: DC
  Administered 2019-04-16 – 2019-04-17 (×2): 100 mg via ORAL
  Filled 2019-04-16 (×2): qty 1

## 2019-04-16 MED ORDER — WITCH HAZEL-GLYCERIN EX PADS: 1.0000 "application " | MEDICATED_PAD | CUTANEOUS | Status: DC | PRN

## 2019-04-16 MED ORDER — SENNOSIDES-DOCUSATE SODIUM 8.6-50 MG PO TABS
2.0000 | ORAL_TABLET | ORAL | Status: DC
  Administered 2019-04-16 – 2019-04-17 (×2): 2 via ORAL
  Filled 2019-04-16 (×2): qty 2

## 2019-04-16 MED ORDER — BENZOCAINE-MENTHOL 20-0.5 % EX AERO
1.0000 "application " | INHALATION_SPRAY | CUTANEOUS | Status: DC | PRN
  Administered 2019-04-18: 1 via TOPICAL
  Filled 2019-04-16 (×3): qty 56

## 2019-04-16 MED ORDER — DIBUCAINE (PERIANAL) 1 % EX OINT: 1.0000 "application " | TOPICAL_OINTMENT | CUTANEOUS | Status: DC | PRN

## 2019-04-16 MED ORDER — ONDANSETRON HCL 4 MG PO TABS: 4.0000 mg | ORAL_TABLET | ORAL | Status: DC | PRN

## 2019-04-16 MED ORDER — SIMETHICONE 80 MG PO CHEW: 80.0000 mg | CHEWABLE_TABLET | ORAL | Status: DC | PRN

## 2019-04-16 MED ORDER — ONDANSETRON HCL 4 MG/2ML IJ SOLN: 4.0000 mg | INTRAMUSCULAR | Status: DC | PRN

## 2019-04-16 MED ORDER — ZOLPIDEM TARTRATE 5 MG PO TABS: 5.0000 mg | ORAL_TABLET | Freq: Every evening | ORAL | Status: DC | PRN

## 2019-04-16 MED ORDER — IBUPROFEN 600 MG PO TABS
600.0000 mg | ORAL_TABLET | Freq: Four times a day (QID) | ORAL | Status: DC
  Administered 2019-04-16 – 2019-04-18 (×9): 600 mg via ORAL
  Filled 2019-04-16 (×9): qty 1

## 2019-04-16 MED ORDER — DIPHENHYDRAMINE HCL 25 MG PO CAPS: 25.0000 mg | ORAL_CAPSULE | Freq: Four times a day (QID) | ORAL | Status: DC | PRN

## 2019-04-16 MED ORDER — COCONUT OIL OIL: 1.0000 "application " | TOPICAL_OIL | Status: DC | PRN

## 2019-04-16 MED ORDER — TETANUS-DIPHTH-ACELL PERTUSSIS 5-2.5-18.5 LF-MCG/0.5 IM SUSP: 0.5000 mL | Freq: Once | INTRAMUSCULAR | Status: DC

## 2019-04-16 NOTE — Progress Notes (Signed)
  Post-Placental IUD Insertion Procedure Note  Patient identified, informed consent signed prior to delivery, signed copy in chart, time out was performed.    Vaginal, labial and perineal areas thoroughly inspected for lacerations. 1st degree laceration identified - repaired prior to insertion of IUD.  Liletta- IUD grasped between sterile gloved fingers. Sterile lubrication applied to sterile gloved hand for ease of insertion. Fundus identified through abdominal wall using non-insertion hand. IUD inserted to fundus with bimanual technique. IUD carefully released at the fundus and insertion hand gently removed from vagina.   Strings trimmed to the level of the introitus. Patient tolerated procedure well.  Patient given post procedure instructions and IUD care card with expiration date.  Patient is asked to keep IUD strings tucked in her vagina until her postpartum follow up visit in 4-6 weeks. Patient advised to abstain from sexual intercourse and pulling on strings before her follow-up visit. Patient verbalized an understanding of the plan of care and agrees.   Rolm Bookbinder, CNM 04/16/19

## 2019-04-16 NOTE — Progress Notes (Signed)
fhr not tracing due to pt's position in bed Pt in high fowler type of position, not wanting to move due to pain

## 2019-04-16 NOTE — Plan of Care (Signed)
progressing 

## 2019-04-16 NOTE — Lactation Note (Signed)
This note was copied from a baby's chart. Lactation Consultation Note  Patient Name: Katie Butler [id number] Date: 04/16/2019 Reason for consult: Initial assessment;Early term 37-38.6wks;Primapara;1st time breastfeeding  P1 mother whose infant is now 64 hours old.  This is an ETI at 37+1 weeks.    Baby was laying in bassinet when I arrived.  Mother had a quick delivery and baby had shoulder dystocia.  His face is bruised and head is molded.  Discussed the LPTI guidelines with mother in detail.  Reviewed feeding cues and hand expression.  Mother has been able to hand express colostrum and has already fed baby with spoon for 5 mls and 7 mls at two separate feedings.  Offered to initiate the DEBP to help with breast stimulation and mother willing to pump.  Pump parts, assembly, disassembly and cleaning reviewed.  #24 flange size is appropriate at this time, however, asked mother to monitor her nipple size with each pumping session since I predict she will need to increase her flange size tomorrow on the right side.  This nipple is larger than the left.  Observed mother pumping for 15 minutes while discussing breast feeding concepts.  Mother knows to awaken baby every three hours if he does not self awaken.  At the end of her pumping session mother was able to obtain 5 mls of EBM.  Demonstrated the curved tip syringe and allowed mother to feed the 5 mls to him.  He consumed it easily and I demonstrated effective burping afterwards.  He burped up a small amount of colostrum tinged thick mucus.    Suggested mother call her RN/LC for latch assistance with the next feeding as needed.  She will post pump for 15 minutes after breast feeding and feed back any EBM she obtains to baby.    Mom made aware of O/P services, breastfeeding support groups, community resources, and our phone # for post-discharge questions.  Mother has a DEBP for home use.  Her support person is her mother who will be spending the night.   RN updated.   Maternal Data Formula Feeding for Exclusion: No Has patient been taught Hand Expression?: Yes Does the patient have breastfeeding experience prior to this delivery?: No  Feeding Feeding Type: Breast Fed  LATCH Score                   Interventions    Lactation Tools Discussed/Used Pump Review: Setup, frequency, and cleaning;Milk Storage Initiated by:: Demarie Hyneman Date initiated:: 04/16/19   Consult Status Consult Status: Follow-up Date: 04/17/19 Follow-up type: In-patient    Mckinley Adelstein R Danielly Ackerley 04/16/2019, 7:06 PM

## 2019-04-16 NOTE — Discharge Summary (Addendum)
Postpartum Discharge Summary     Patient Name: Katie Butler DOB: 02/09/1995 MRN: [id number]  Date of admission: 04/15/2019 Delivering Provider: Wende Mott   Date of discharge: 04/18/2019  Admitting diagnosis: Labor and delivery, indication for care [O75.9] Intrauterine pregnancy: [redacted]w[redacted]d    Secondary diagnosis:  Active Problems:   Labor and delivery, indication for care   Shoulder dystocia during labor and delivery  Additional problems: None     Discharge diagnosis: Term Pregnancy Delivered                                                                                                Post partum procedures:None  Augmentation: AROM, Pitocin and Cytotec  Complications: None  Hospital course:  Induction of Labor With Vaginal Delivery   24y.o. yo G1P0000 at 368w1das admitted to the hospital 04/15/2019 for induction of labor.  Indication for induction: decreased fetal movement.  Patient had an uncomplicated labor course as follows: Membrane Rupture Time/Date: 2:46 AM ,04/16/2019   Intrapartum Procedures: Episiotomy: None [1]                                         Lacerations:  1st degree [2]  Patient had delivery of a Viable infant.  Information for the patient's newborn:  CaTeniola, Tseng0[[id number]]Delivery Method: Vag-Spont    04/16/2019  Details of delivery can be found in separate delivery note. IUD placed after delivery. Hx of depression; patient reported stable mood and no concerns; f/u PRN outpatient. Patient had a routine postpartum course. Patient is discharged home 04/18/19. Delivery time: 9:48 AM    Magnesium Sulfate received: No BMZ received: No Rhophylac:No MMR:No Transfusion:No  Physical exam  Vitals:   04/17/19 1412 04/17/19 1936 04/17/19 2246 04/18/19 0553  BP: 117/78 (!) 107/56 106/75 113/64  Pulse: 69 98 82 68  Resp: 18 17 18 17   Temp: 98.2 F (36.8 C) 97.8 F (36.6 C) 98.1 F (36.7 C) 98 F (36.7 C)  TempSrc: Oral Oral Oral Oral   SpO2: 100% 100%  100%  Weight:      Height:       General: alert, cooperative and no distress Lochia: appropriate Uterine Fundus: firm Incision: N/A DVT Evaluation: No evidence of DVT seen on physical exam. Labs: Lab Results  Component Value Date   WBC 12.6 (H) 04/15/2019   HGB 11.2 (L) 04/15/2019   HCT 34.8 (L) 04/15/2019   MCV 95.6 04/15/2019   PLT 287 04/15/2019   CMP Latest Ref Rng & Units 06/09/2012  Glucose 70 - 99 mg/dL 98  BUN 6 - 23 mg/dL 12  Creatinine 0.10 - 1.20 mg/dL 0.72  Sodium 135 - 145 mEq/L 141  Potassium 3.5 - 5.3 mEq/L 4.4  Chloride 96 - 112 mEq/L 102  CO2 19 - 32 mEq/L 25  Calcium 8.4 - 10.5 mg/dL 10.0   Edinburgh Score: Edinburgh Postnatal Depression Scale Screening Tool 04/16/2019  I have been able to laugh and see the  funny side of things. 0  I have looked forward with enjoyment to things. 0  I have blamed myself unnecessarily when things went wrong. 1  I have been anxious or worried for no good reason. 2  I have felt scared or panicky for no good reason. 2  Things have been getting on top of me. 1  I have been so unhappy that I have had difficulty sleeping. 0  I have felt sad or miserable. 1  I have been so unhappy that I have been crying. 1  The thought of harming myself has occurred to me. 1  Edinburgh Postnatal Depression Scale Total 9    Discharge instruction: per After Visit Summary and "Baby and Me Booklet".  After visit meds:  Allergies as of 04/18/2019   No Known Allergies     Medication List    STOP taking these medications   famotidine 20 MG tablet Commonly known as: PEPCID     TAKE these medications   acetaminophen 325 MG tablet Commonly known as: Tylenol Take 2 tablets (650 mg total) by mouth every 6 (six) hours as needed (for pain scale < 4).   ibuprofen 600 MG tablet Commonly known as: ADVIL Take 1 tablet (600 mg total) by mouth every 6 (six) hours.   prenatal multivitamin Tabs tablet Take 1 tablet by mouth at  bedtime.   senna-docusate 8.6-50 MG tablet Commonly known as: Senokot-S Take 2 tablets by mouth daily. Start taking on: April 19, 2019   sertraline 100 MG tablet Commonly known as: Zoloft Take 1 tablet (100 mg total) by mouth daily. What changed: when to take this       Diet: routine diet  Activity: Advance as tolerated. Pelvic rest for 6 weeks.   Outpatient follow up:4 weeks Follow up Appt: Future Appointments  Date Time Provider [place]  04/25/2019  2:00 PM Cottle, Lucious [place]ves, LCSW [facility] None  05/09/2019  2:00 PM Cottle, Bambi G, LCSW [facility] None  05/19/2019 10:45 AM Truett Mainland, DO [facility] None  05/23/2019  2:00 PM Cottle, Bambi G, LCSW [facility] None  06/06/2019  2:00 PM Cottle, Bambi G, LCSW [facility] None  06/20/2019  2:00 PM Cottle, Bambi G, LCSW [facility] None  07/04/2019  2:00 PM Cottle, Bambi G, LCSW [facility] None   Follow up Visit:    Please schedule this patient for Postpartum visit in: 4 weeks with the following provider: Any provider In-Person For C/S patients schedule nurse incision check in weeks 2 weeks: no Low risk pregnancy complicated by: decreased fetal movement Delivery mode:  SVD Anticipated Birth Control:  PP IUD Placed PP Procedures needed: string check  Schedule Integrated BH visit: no     Newborn Data: Live born female  Birth Weight: 3305g APGAR: 5, 9  Newborn Delivery   Birth date/time: 04/16/2019 09:48:00 Delivery type: Vaginal, Spontaneous      Baby Feeding: Breast Disposition:home with mother

## 2019-04-16 NOTE — Progress Notes (Signed)
LABOR PROGRESS NOTE  Katie Butler is a 24 y.o. G1P0000 at [redacted]w[redacted]d  admitted for IOL for decreased fetal movement.  Subjective: Patient starting to feel pressure. She says "I just want him out."   Objective: BP (!) 105/55   Pulse 62   Temp 98.2 F (36.8 C) (Oral)   Resp 16   Ht 5\' 7"  (1.702 m)   Wt 72.2 kg   LMP 07/10/2018 (LMP Unknown)   BMI 24.92 kg/m  or  Vitals:   04/15/19 1532 04/15/19 1630 04/15/19 1835 04/15/19 2330  BP: (!) 107/46 118/73 120/77 (!) 105/55  Pulse: 70 62 66 62  Resp: 17 14 16 16   Temp:  98.7 F (37.1 C) 98.2 F (36.8 C)   TempSrc:  Oral Oral   Weight:      Height:         Dilation: 2.5 Effacement (%): 80 Station: -1 Presentation: Vertex Exam by:: J Mbugua RN FHT: baseline rate 140, moderate varibility, positive acel, some variable decels Toco: q2-4 min  Labs: Lab Results  Component Value Date   WBC 12.6 (H) 04/15/2019   HGB 11.2 (L) 04/15/2019   HCT 34.8 (L) 04/15/2019   MCV 95.6 04/15/2019   PLT 287 04/15/2019    Patient Active Problem List   Diagnosis Date Noted  . Labor and delivery, indication for care 04/15/2019  . No leakage of amniotic fluid into vagina 04/05/2019  . Decreased fetal movement in third trimester 04/05/2019  . 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 Rubella non-immune status, antepartum 10/01/2018  . Supervision of normal first pregnancy 09/30/2018  . Depression affecting pregnancy, antepartum 09/30/2018  . Disordered eating 11/22/2014  . Proteinuria 06/08/2012    Assessment / Plan: 24 y.o. G1P0000 at [redacted]w[redacted]d here for IOL for decreased fetal movements.  Labor: s/p cytotec x2. Last one at 1341. Continue pitocin. Consider AROM as indicated. Will recheck in 4 hours.  Fetal Wellbeing:  Cat 2 but good variability Pain Control:  Patient would like to "be as natural as possible" GBS: negative Anticipated MOD:  VD  36, MD OB Resident 04/16/2019, 1:00 AM

## 2019-04-16 NOTE — Progress Notes (Signed)
LABOR PROGRESS NOTE  Katie Butler is a 24 y.o. G1P0000 at [redacted]w[redacted]d  admitted for IOL for decreased fetal movement.  Subjective: Patient has been trying to get some sleep.    Objective: BP (!) 112/59   Pulse 65   Temp 98.1 F (36.7 C) (Oral)   Resp 17   Ht 5\' 7"  (1.702 m)   Wt 72.2 kg   LMP 07/10/2018 (LMP Unknown)   BMI 24.92 kg/m  or  Vitals:   04/16/19 0200 04/16/19 0430 04/16/19 0630 04/16/19 0731  BP: (!) 110/58  106/65 (!) 112/59  Pulse: 61  60 65  Resp:   16 17  Temp:  98.1 F (36.7 C)    TempSrc:  Oral    Weight:      Height:         Dilation: 6 Effacement (%): 90 Station: -1 Presentation: Vertex Exam by:: J Mbugua RN FHT: baseline rate 135, moderate variability, positive acel, occasional variable decels  Toco: q3-5 min  Labs: Lab Results  Component Value Date   WBC 12.6 (H) 04/15/2019   HGB 11.2 (L) 04/15/2019   HCT 34.8 (L) 04/15/2019   MCV 95.6 04/15/2019   PLT 287 04/15/2019    Patient Active Problem List   Diagnosis Date Noted  . Labor and delivery, indication for care 04/15/2019  . No leakage of amniotic fluid into vagina 04/05/2019  . Decreased fetal movement in third trimester 04/05/2019  . 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 Rubella non-immune status, antepartum 10/01/2018  . Supervision of normal first pregnancy 09/30/2018  . Depression affecting pregnancy, antepartum 09/30/2018  . Disordered eating 11/22/2014  . Proteinuria 06/08/2012    Assessment / Plan: 24 y.o. G1P0000 at [redacted]w[redacted]d here for IOL for decreased fetal movements.  Labor: S/p cytotec x2. Some variables and lates around 2015 last night which resolved with position changes. Pit started and then turned off around 0215 due to recurrent lates. Baby has recovered. Discussed plan with Dr. [redacted]w[redacted]d and then patient. AROM for large amount of clear fluid and IUPC placed. Started Pitocin. Baby tolerating well. Amnioinfusion as needed. Anticipate VD; CS as appropriate should decels  recur. Continuing to make cervical change. Fetal Wellbeing:  Cat I currently  Pain Control:  Patient would like to "be as natural as possible" GBS: negative  [name] MD OB Resident 04/16/2019, 7:42 AM

## 2019-04-16 NOTE — Progress Notes (Signed)
LABOR PROGRESS NOTE  Katie Butler is a 24 y.o. G1P0000 at [redacted]w[redacted]d  admitted for IOL for decreased fetal movement.  Subjective: Sleeping. Comfortable. Not feeling ctx.   Objective: BP (!) 110/58   Pulse 61   Temp 98.4 F (36.9 C) (Oral)   Resp 16   Ht 5\' 7"  (1.702 m)   Wt 72.2 kg   LMP 07/10/2018 (LMP Unknown)   BMI 24.92 kg/m  or  Vitals:   04/15/19 1630 04/15/19 1835 04/15/19 2330 04/16/19 0200  BP: 118/73 120/77 (!) 105/55 (!) 110/58  Pulse: 62 66 62 61  Resp: 14 16 16    Temp: 98.7 F (37.1 C) 98.2 F (36.8 C) 98.4 F (36.9 C)   TempSrc: Oral Oral Oral   Weight:      Height:         Dilation: 3 Effacement (%): 80 Station: -1 Presentation: Vertex Exam by:: J Mbugua RN FHT: baseline rate 130, moderate varibility, positive acel, no decels currently  Toco: q2-4 min  Labs: Lab Results  Component Value Date   WBC 12.6 (H) 04/15/2019   HGB 11.2 (L) 04/15/2019   HCT 34.8 (L) 04/15/2019   MCV 95.6 04/15/2019   PLT 287 04/15/2019    Patient Active Problem List   Diagnosis Date Noted  . Labor and delivery, indication for care 04/15/2019  . No leakage of amniotic fluid into vagina 04/05/2019  . Decreased fetal movement in third trimester 04/05/2019  . 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 Rubella non-immune status, antepartum 10/01/2018  . Supervision of normal first pregnancy 09/30/2018  . Depression affecting pregnancy, antepartum 09/30/2018  . Disordered eating 11/22/2014  . Proteinuria 06/08/2012    Assessment / Plan: 24 y.o. G1P0000 at [redacted]w[redacted]d here for IOL for decreased fetal movements.  Labor: S/p cytotec x2. Some variables and lates around 2015 last night which resolved with position changes. Pit started and then turned off around 0215 due to recurrent lates. Baby has recovered. Discussed plan with Dr. 36 and then patient. AROM for large amount of clear fluid and IUPC placed. Will plan to start Pitocin in one hour if patient still not feeling ctx.  Amnioinfusion as needed. Anticipate VD; CS as appropriate should decels recur and no cervical change. Fetal Wellbeing:  Cat I currently  Pain Control:  Patient would like to "be as natural as possible" GBS: negative  [redacted]w[redacted]d, MD [facility] Fellow, [facility] for [facility], [facility] 04/16/2019, 2:53 AM

## 2019-04-17 NOTE — Progress Notes (Signed)
Post Partum Day 1 Subjective: Patient reports feeling well. She is tolerating PO. Ambulating and urinating without difficulty. Lochia minimal. Breast-pumping currently. Would like to discharge today if possible.   Objective: Blood pressure 112/73, pulse 63, temperature 98 F (36.7 C), temperature source Oral, resp. rate 16, height 5\' 7"  (1.702 m), weight 72.2 kg, last menstrual period 07/10/2018, SpO2 100 %, unknown if currently breastfeeding.  Physical Exam:  General: alert, cooperative and appears stated age Lochia: appropriate Uterine Fundus: firm Incision: NA DVT Evaluation: No evidence of DVT seen on physical exam.  Recent Labs    04/15/19 0922  HGB 11.2*  HCT 34.8*    Assessment/Plan: Plan for discharge tomorrow. Okay to discharge today if baby can; RN to page team for orders. Vitals stable Breastfeeding S/p IUD insertion Hx of depression; denies symptoms currently   LOS: 2 days   06/15/19 04/17/2019, 4:41 AM

## 2019-04-17 NOTE — Lactation Note (Signed)
This note was copied from a baby's chart. Lactation Consultation Note  Patient Name: Boy Ova Gillentine [id number] Date: 04/17/2019 Reason for consult: Follow-up assessment;Difficult latch Baby is 27 hours old/5% weight loss.  Mom is currently sleeping soundly.  Baby is in crib quiet but awake.  Baby has not latched to breast yet.  Mom is pumping 5 mls every 3 hours and syringe feeding baby.  Baby now should be receiving 10-20 mls every 3 hours so additional formula or donor milk is needed.  Spoke to RN and NP and recommended discharge should be delayed to tomorrow due to poor feeding.  I also recommended a bottle for feedings since volume needed has increased.  Expressed milk may be syringe fed but supplement will be bottle fed.  Once mom is awake we can continue to work on latch.  Maternal Data    Feeding Feeding Type: Breast Fed  LATCH Score                   Interventions    Lactation Tools Discussed/Used     Consult Status Consult Status: Follow-up Date: 04/18/19 Follow-up type: In-patient    Huston Foley 04/17/2019, 12:48 PM

## 2019-04-17 NOTE — Progress Notes (Signed)
CSW received consult due to h/o eating disorder and score 9 with 1 on question 10 on Edinburgh Depression Screen.  CSW visited MOB at bedside to discuss mental health history. Infant, Katie Butler, was present and being held by MOB. MOB was pleasant, insightful, and engaged throughout visit.   MOB identified current mood as "happy" and reported she "already loves being a mom, I always wanted to be a mother". MOB denied any current SI, HI, or domestic violence concerns. 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 MOB reports sx are well managed with active medication(Zoloft) and therapy every other Monday with therapist Bambi Cottle. MOB reported medication, and EMDR and talk therapy have been very helpful. MOB reported consistent therapy for the past 1.5 years. MOB reported strategies she used to redirect unhealthy focus on weight. Also, MOB reported plans to communicate with pediatrician and therapist so she can assure support with healthy weight while breastfeeding. MOB identified her therapist, mom, and grandmother as support system.   CSW provided education regarding Baby Blues vs PMADs and provided MOB with resources for mental health follow up.  CSW encouraged MOB to evaluate her mental health throughout the postpartum period with the use of the New Mom Checklist developed by Postpartum Progress as well as the Edinburgh Postnatal Depression Scale and notify a medical professional if symptoms arise. MOB expressed understanding and denied any additional questions or concerns.   CSW provided review of Sudden Infant Death Syndrome (SIDS) precautions. MOB confirmed having all needed items for baby including car seat and crib for safe sleeping area.    CSW identifies no further need for intervention and no barriers to discharge at this time.  Owain Eckerman D. Katie Butler, MSW, LCSWA Clinical Social Worker [phone]    

## 2019-04-18 MED ORDER — IBUPROFEN 600 MG PO TABS: 600.0000 mg | ORAL_TABLET | Freq: Four times a day (QID) | ORAL | 0 refills | Status: DC

## 2019-04-18 MED ORDER — ACETAMINOPHEN 325 MG PO TABS: 650.0000 mg | ORAL_TABLET | Freq: Four times a day (QID) | ORAL | 0 refills | Status: DC | PRN

## 2019-04-18 MED ORDER — SENNOSIDES-DOCUSATE SODIUM 8.6-50 MG PO TABS: 2.0000 | ORAL_TABLET | ORAL | 0 refills | Status: DC

## 2019-04-18 NOTE — Lactation Note (Signed)
This note was copied from a baby's chart. Lactation Consultation Note Baby 40 hrs old. Encouraged mom to call for next feeding, LC wanted to assist in latching. Mom states baby will only latch for a few minutes. Mom stated she has lots of colostrum. Mom has DEBP at bedside. Discussed w/mom engorgement and management, milk storage, pumping for storage and increasing milk supply. Baby sleeping in bassinet.  Patient Name: Katie Butler [id number] Date: 04/18/2019 Reason for consult: Follow-up assessment;Primapara;Early term 37-38.6wks   Maternal Data    Feeding    LATCH Score                   Interventions    Lactation Tools Discussed/Used     Consult Status Consult Status: Follow-up Date: 04/18/19 Follow-up type: In-patient    Abelina Ketron, Diamond Nickel 04/18/2019, 2:16 AM

## 2019-04-18 NOTE — Lactation Note (Signed)
This note was copied from a baby's chart. Lactation Consultation Note  Patient Name: Katie Butler [id number] Date: 04/18/2019 Reason for consult: Follow-up assessment;Difficult latch Baby is 47 hours old/6% weight loss.  Mom states baby is having difficulty latching because her nipple is too big for baby's mouth.  Baby just took 30 mls of formula per bottle.  Mom is pumping and last obtained 30 mls.  Discussed milk coming to volume and the prevention and treatment of engorgement.  Mom has a breast pump at home.  Instructed to call for latch assist prior to discharge if desired.  Reviewed outpatient services and encouraged to call prn.  Maternal Data    Feeding Feeding Type: Bottle Fed - Formula  LATCH Score                   Interventions    Lactation Tools Discussed/Used     Consult Status Consult Status: Complete Follow-up type: Call as needed    Huston Foley 04/18/2019, 9:06 AM

## 2019-04-25 ENCOUNTER — Ambulatory Visit (INDEPENDENT_AMBULATORY_CARE_PROVIDER_SITE_OTHER): Payer: Medicaid Other | Admitting: Psychology

## 2019-04-25 DIAGNOSIS — F4310 Post-traumatic stress disorder, unspecified: Secondary | ICD-10-CM

## 2019-04-27 ENCOUNTER — Telehealth: Payer: Self-pay

## 2019-04-27 NOTE — Telephone Encounter (Signed)
Patient called and is postpartum. Patient had IUD placed immediately after delivery. Patient states she can see her strings hanging out. Patient made aware that she should come in for visit. I also made her aware that until we evaluate it she doesn't have reliable contraception and if it falls out that is ok. I assured her we can replace it at her visit. Patient states understanding. Armandina Stammer RN

## 2019-04-29 ENCOUNTER — Encounter: Payer: Self-pay | Admitting: Obstetrics & Gynecology

## 2019-04-29 ENCOUNTER — Other Ambulatory Visit: Payer: Self-pay

## 2019-04-29 ENCOUNTER — Ambulatory Visit (INDEPENDENT_AMBULATORY_CARE_PROVIDER_SITE_OTHER): Payer: Medicaid Other | Admitting: Obstetrics & Gynecology

## 2019-04-29 VITALS — BP 119/65 | HR 78 | Ht 67.0 in | Wt 139.0 lb

## 2019-04-29 DIAGNOSIS — Z30431 Encounter for routine checking of intrauterine contraceptive device: Secondary | ICD-10-CM

## 2019-04-29 NOTE — Progress Notes (Signed)
  GYNECOLOGY OFFICE ENCOUNTER NOTE  History:  24 y.o. G1P1001 here today for today for IUD string check; Liletta  IUD was placed PP on 04/16/2019.  She repots that she is seeing her strings. NO concerning side effects. She is nursing and otherwise doing well PP.  The following portions of the patient's history were reviewed and updated as appropriate: allergies, current medications, past family history, past medical history, past social history, past surgical history and problem list.   Review of Systems:  Pertinent items are noted in HPI.   Objective:  Physical Exam Blood pressure 119/65, pulse 78, height 5\' 7"  (1.702 m), weight 139 lb (63 kg), currently breastfeeding. CONSTITUTIONAL: Well-developed, well-nourished female in no acute distress.  HENT:  Normocephalic, atraumatic. External right and left ear normal. Oropharynx is clear and moist EYES: Conjunctivae and EOM are normal. Pupils are equal, round, and reactive to light. No scleral icterus.  NECK: Normal range of motion, supple, no masses CARDIOVASCULAR: Normal heart rate noted RESPIRATORY: Effort and breath sounds normal, no problems with respiration noted ABDOMEN: Soft, no distention noted.   PELVIC: Normal appearing external genitalia; normal appearing vaginal mucosa and cervix.  IUD strings visualized, about 9 cm in length outside cervix. These were trimmmed to 3 cm  Assessment & Plan:  Patient to keep IUD in place for up to six years; can come in for removal if she desires pregnancy earlier or for any concerning side effects. Pt to f/u in 4 weeks for PP visit.    Kathalene Sporer L. Harraway-Smith, MD, FACOG Obstetrician & Gynecologist, [facility] for [facility]

## 2019-04-29 NOTE — Patient Instructions (Signed)
Levonorgestrel intrauterine device (IUD) What is this medicine? LEVONORGESTREL IUD (LEE voe nor jes trel) is a contraceptive (birth control) device. The device is placed inside the uterus by a healthcare professional. It is used to prevent pregnancy. This device can also be used to treat heavy bleeding that occurs during your period. This medicine may be used for other purposes; ask your health care provider or pharmacist if you have questions. COMMON BRAND NAME(S): Kyleena, LILETTA, Mirena, Skyla What should I tell my health care provider before I take this medicine? They need to know if you have any of these conditions:  abnormal Pap smear  cancer of the breast, uterus, or cervix  diabetes  endometritis  genital or pelvic infection now or in the past  have more than one sexual partner or your partner has more than one partner  heart disease  history of an ectopic or tubal pregnancy  immune system problems  IUD in place  liver disease or tumor  problems with blood clots or take blood-thinners  seizures  use intravenous drugs  uterus of unusual shape  vaginal bleeding that has not been explained  an unusual or allergic reaction to levonorgestrel, other hormones, silicone, or polyethylene, medicines, foods, dyes, or preservatives  pregnant or trying to get pregnant  breast-feeding How should I use this medicine? This device is placed inside the uterus by a health care professional. Talk to your pediatrician regarding the use of this medicine in children. Special care may be needed. Overdosage: If you think you have taken too much of this medicine contact a poison control center or emergency room at once. NOTE: This medicine is only for you. Do not share this medicine with others. What if I miss a dose? This does not apply. Depending on the brand of device you have inserted, the device will need to be replaced every 3 to 6 years if you wish to continue using this type  of birth control. What may interact with this medicine? Do not take this medicine with any of the following medications:  amprenavir  bosentan  fosamprenavir This medicine may also interact with the following medications:  aprepitant  armodafinil  barbiturate medicines for inducing sleep or treating seizures  bexarotene  boceprevir  griseofulvin  medicines to treat seizures like carbamazepine, ethotoin, felbamate, oxcarbazepine, phenytoin, topiramate  modafinil  pioglitazone  rifabutin  rifampin  rifapentine  some medicines to treat HIV infection like atazanavir, efavirenz, indinavir, lopinavir, nelfinavir, tipranavir, ritonavir  St. John's wort  warfarin This list may not describe all possible interactions. Give your health care provider a list of all the medicines, herbs, non-prescription drugs, or dietary supplements you use. Also tell them if you smoke, drink alcohol, or use illegal drugs. Some items may interact with your medicine. What should I watch for while using this medicine? Visit your doctor or health care professional for regular check ups. See your doctor if you or your partner has sexual contact with others, becomes HIV positive, or gets a sexual transmitted disease. This product does not protect you against HIV infection (AIDS) or other sexually transmitted diseases. You can check the placement of the IUD yourself by reaching up to the top of your vagina with clean fingers to feel the threads. Do not pull on the threads. It is a good habit to check placement after each menstrual period. Call your doctor right away if you feel more of the IUD than just the threads or if you cannot feel the threads at   all. The IUD may come out by itself. You may become pregnant if the device comes out. If you notice that the IUD has come out use a backup birth control method like condoms and call your health care provider. Using tampons will not change the position of the  IUD and are okay to use during your period. This IUD can be safely scanned with magnetic resonance imaging (MRI) only under specific conditions. Before you have an MRI, tell your healthcare provider that you have an IUD in place, and which type of IUD you have in place. What side effects may I notice from receiving this medicine? Side effects that you should report to your doctor or health care professional as soon as possible:  allergic reactions like skin rash, itching or hives, swelling of the face, lips, or tongue  fever, flu-like symptoms  genital sores  high blood pressure  no menstrual period for 6 weeks during use  pain, swelling, warmth in the leg  pelvic pain or tenderness  severe or sudden headache  signs of pregnancy  stomach cramping  sudden shortness of breath  trouble with balance, talking, or walking  unusual vaginal bleeding, discharge  yellowing of the eyes or skin Side effects that usually do not require medical attention (report to your doctor or health care professional if they continue or are bothersome):  acne  breast pain  change in sex drive or performance  changes in weight  cramping, dizziness, or faintness while the device is being inserted  headache  irregular menstrual bleeding within first 3 to 6 months of use  nausea This list may not describe all possible side effects. Call your doctor for medical advice about side effects. You may report side effects to FDA at 1-800-FDA-1088. Where should I keep my medicine? This does not apply. NOTE: This sheet is a summary. It may not cover all possible information. If you have questions about this medicine, talk to your doctor, pharmacist, or health care provider.  2020 Elsevier/Gold Standard (2017-12-01 13:22:01)  

## 2019-04-29 NOTE — Progress Notes (Signed)
Patient is 13 days postpartum and states that her IUD strings are hanging out. Patient had a post delivery IUD placed. Armandina Stammer RN

## 2019-05-09 ENCOUNTER — Ambulatory Visit (INDEPENDENT_AMBULATORY_CARE_PROVIDER_SITE_OTHER): Payer: Medicaid Other | Admitting: Psychology

## 2019-05-09 DIAGNOSIS — F411 Generalized anxiety disorder: Secondary | ICD-10-CM

## 2019-05-09 DIAGNOSIS — F4310 Post-traumatic stress disorder, unspecified: Secondary | ICD-10-CM | POA: Diagnosis not present

## 2019-05-19 ENCOUNTER — Ambulatory Visit (INDEPENDENT_AMBULATORY_CARE_PROVIDER_SITE_OTHER): Payer: Medicaid Other | Admitting: Family Medicine

## 2019-05-19 ENCOUNTER — Other Ambulatory Visit: Payer: Self-pay

## 2019-05-19 DIAGNOSIS — O99340 Other mental disorders complicating pregnancy, unspecified trimester: Secondary | ICD-10-CM

## 2019-05-19 DIAGNOSIS — O99345 Other mental disorders complicating the puerperium: Secondary | ICD-10-CM

## 2019-05-19 DIAGNOSIS — F329 Major depressive disorder, single episode, unspecified: Secondary | ICD-10-CM

## 2019-05-19 NOTE — Progress Notes (Signed)
Subjective:     Katie Butler is a 24 y.o. female who presents for a postpartum visit. She is 4 weeks postpartum following a spontaneous vaginal delivery. I have fully reviewed the prenatal and intrapartum course. The delivery was at 37 gestational weeks. Outcome: spontaneous vaginal delivery. Anesthesia: none. Postpartum course has been normal. Baby's course has been normal. Baby is feeding by breast. Bleeding thin lochia and red. Bowel function is normal. Bladder function is normal. Patient is not sexually active. Contraception method is IUD. Postpartum depression screening: negative.  The following portions of the patient's history were reviewed and updated as appropriate: allergies, current medications, past family history, past medical history, past social history, past surgical history and problem list.  Review of Systems Pertinent items are noted in HPI.   Objective:    There were no vitals taken for this visit.  General:  alert, cooperative and no distress  Lungs: clear to auscultation bilaterally  Heart:  regular rate and rhythm, S1, S2 normal, no murmur, click, rub or gallop  Abdomen: soft, non-tender; bowel sounds normal; no masses,  no organomegaly   Vulva:  normal. Some tenderness to perineum. Sutures still present, but well healed.  Vagina: normal vagina, no discharge, exudate, lesion, or erythema  Cervix:  multiparous appearance        Assessment:     Normal postpartum exam. Pap smear not done at today's visit.   Plan:    1. Contraception: IUD 2. Perineum still sensitive. Expect this to resolve in the next few weeks. 3. Still on zoloft - feels that dose is appropriate. 4. Follow up in: 6 months or as needed.

## 2019-05-19 NOTE — Progress Notes (Signed)
History:  [name] is a 24 y.o. G1P1001 who presents to clinic today for routine postpartum followup.  She reports that she is doing well overall and "loves being a mom." She is breastfeeding with moderate success, she reports good supply, but states that her baby has a weak latch. He was evaluated by lactation in the hospital and found to have a high palate. She is able to pump and bottle-feed breastmilk after a trial at the breast and is happy with this arrangement for now. She had one instance of what she believed to be a clogged milk duct, but this was self-resolving. She is aware of lactation services and may be interested in a lactation consult should this issue persist. She is motivated to continue breastfeeding.   Her mood has been "good" she reports having a strong support system and continues to go to therapy once per week.   She states that she is still having some vaginal bleeding. Her bleeding had reduced to spotting until about a week ago when she had several days of "period-amounts" of bleeding that have since resolved.   She also states that she was found to have anterior vaginal wall prolapse during pregnancy and is interested in knowing if this has resolved.   The following portions of the patient's history were reviewed and updated as appropriate: allergies, current medications, family history, past medical history, social history, past surgical history and problem list.  Review of Systems:  Review of Systems  Constitutional: Negative for chills and fever.  Gastrointestinal: Negative for constipation and diarrhea.  Genitourinary: Negative for dysuria, flank pain, frequency and urgency.  Neurological: Negative for dizziness.  Psychiatric/Behavioral: Negative for depression. The patient is not nervous/anxious.   All other systems reviewed and are negative.     Objective:  Physical Exam BP 105/61   Pulse 95   Ht 5\' 7"  (1.702 m)   Wt 145 lb (65.8 kg)   BMI  22.71 kg/m  Physical Exam Exam conducted with a chaperone present.  Genitourinary:    IUD strings appropriately placed, approximately 5cm in length  Labs and Imaging No results found for this or any previous visit (from the past 24 hour(s)).  No results found.   Assessment & Plan:  Routine postpartum care: - Educated on availability of lactation services - Encouraged continued psychotherapy - Reviewed care for clogged milk ducts/mastitis  - IUD strings trimmed to 3 cm  , Medical Student 05/19/2019 11:43 AM

## 2019-05-23 ENCOUNTER — Ambulatory Visit (INDEPENDENT_AMBULATORY_CARE_PROVIDER_SITE_OTHER): Payer: Medicaid Other | Admitting: Psychology

## 2019-05-23 DIAGNOSIS — F4310 Post-traumatic stress disorder, unspecified: Secondary | ICD-10-CM

## 2019-06-06 ENCOUNTER — Ambulatory Visit: Payer: Medicaid Other | Admitting: Psychology

## 2019-06-20 ENCOUNTER — Ambulatory Visit: Payer: Medicaid Other | Admitting: Psychology

## 2019-07-04 ENCOUNTER — Ambulatory Visit: Payer: Medicaid Other | Admitting: Psychology

## 2019-07-25 ENCOUNTER — Ambulatory Visit: Payer: Medicaid Other | Admitting: Family Medicine

## 2019-08-05 ENCOUNTER — Other Ambulatory Visit: Payer: Self-pay

## 2019-08-05 ENCOUNTER — Ambulatory Visit: Payer: Medicaid Other | Admitting: Family Medicine

## 2019-09-03 ENCOUNTER — Other Ambulatory Visit: Payer: Self-pay | Admitting: Family Medicine

## 2019-09-03 DIAGNOSIS — F32A Depression, unspecified: Secondary | ICD-10-CM

## 2019-09-13 ENCOUNTER — Ambulatory Visit (INDEPENDENT_AMBULATORY_CARE_PROVIDER_SITE_OTHER): Payer: Medicaid Other | Admitting: Obstetrics and Gynecology

## 2019-09-13 ENCOUNTER — Encounter: Payer: Self-pay | Admitting: Obstetrics and Gynecology

## 2019-09-13 ENCOUNTER — Other Ambulatory Visit: Payer: Self-pay

## 2019-09-13 VITALS — BP 110/58 | HR 86 | Ht 67.0 in | Wt 160.0 lb

## 2019-09-13 DIAGNOSIS — Z975 Presence of (intrauterine) contraceptive device: Secondary | ICD-10-CM | POA: Diagnosis not present

## 2019-09-13 DIAGNOSIS — F32A Depression, unspecified: Secondary | ICD-10-CM

## 2019-09-13 DIAGNOSIS — N921 Excessive and frequent menstruation with irregular cycle: Secondary | ICD-10-CM | POA: Diagnosis not present

## 2019-09-13 DIAGNOSIS — F329 Major depressive disorder, single episode, unspecified: Secondary | ICD-10-CM

## 2019-09-13 DIAGNOSIS — O99340 Other mental disorders complicating pregnancy, unspecified trimester: Secondary | ICD-10-CM

## 2019-09-13 MED ORDER — SERTRALINE HCL 100 MG PO TABS: 100.0000 mg | ORAL_TABLET | Freq: Every day | ORAL | 1 refills | Status: DC

## 2019-09-13 MED ORDER — NORGESTIMATE-ETH ESTRADIOL 0.25-35 MG-MCG PO TABS: 1.0000 | ORAL_TABLET | Freq: Every day | ORAL | 0 refills | Status: DC

## 2019-09-13 NOTE — Progress Notes (Signed)
GYNECOLOGY ANNUAL PREVENTATIVE CARE ENCOUNTER NOTE  History:     Katie Butler is a 24 y.o. G76P1001 female here with concerns about vaginal bleeding. She had an IUD placed after the birth of her son in march 2021. Since then she has had off and on vaginal bleeding. She started bleeding on July 28 and has not stopped. Bleeding is interfering with her ADL's.    Gynecologic History Patient's last menstrual period was 08/31/2019. Contraception: IUD Last Pap: Due, age 51   Obstetric History OB History  Gravida Para Term Preterm AB Living  1 1 1  0 0 1  SAB TAB Ectopic Multiple Live Births  0 0 0 0 1    # Outcome Date GA Lbr Len/2nd Weight Sex Delivery Anes PTL Lv  1 Term 04/16/19 [redacted]w[redacted]d 04:43 / 00:13 7 lb 4.6 oz (3.305 kg) M Vag-Spont None  LIV    Past Medical History:  Diagnosis Date  . Mastitis   . Mental disorder    depression    Past Surgical History:  Procedure Laterality Date  . NO PAST SURGERIES      Current Outpatient Medications on File Prior to Visit  Medication Sig Dispense Refill  . levonorgestrel (LILETTA, 52 MG,) 19.5 MCG/DAY IUD IUD 1 each by Intrauterine route once.    [redacted]w[redacted]d acetaminophen (TYLENOL) 325 MG tablet Take 2 tablets (650 mg total) by mouth every 6 (six) hours as needed (for pain scale < 4). (Patient not taking: Reported on 05/19/2019) 30 tablet 0  . ibuprofen (ADVIL) 600 MG tablet Take 1 tablet (600 mg total) by mouth every 6 (six) hours. (Patient not taking: Reported on 05/19/2019) 30 tablet 0  . Prenatal Vit-Fe Fumarate-FA (PRENATAL MULTIVITAMIN) TABS tablet Take 1 tablet by mouth at bedtime.    . senna-docusate (SENOKOT-S) 8.6-50 MG tablet Take 2 tablets by mouth daily. (Patient not taking: Reported on 05/19/2019) 30 tablet 0  . sertraline (ZOLOFT) 100 MG tablet Take 1 tablet (100 mg total) by mouth daily. (Patient not taking: Reported on 09/13/2019) 90 tablet 1   No current facility-administered medications on file prior to visit.    No Known  Allergies  Social History:  reports that she has never smoked. She has never used smokeless tobacco. She reports that she does not drink alcohol and does not use drugs.  Family History  Problem Relation Age of Onset  . Cancer Other        breast  . Arthritis Maternal Grandmother   . Fibromyalgia Maternal Grandmother   . Scoliosis Maternal Grandmother   . Arthritis Maternal Grandfather   . Hypertension Maternal Grandfather   . Cancer Maternal Grandfather        lung, prostate  . Hyperlipidemia Maternal Grandfather   . Arthritis Paternal Grandmother   . Diabetes Paternal Grandmother   . Other Paternal Grandmother   . Arthritis Paternal Grandfather     The following portions of the patient's history were reviewed and updated as appropriate: allergies, current medications, past family history, past medical history, past social history, past surgical history and problem list.  Review of Systems Pertinent items noted in HPI and remainder of comprehensive ROS otherwise negative.  Physical Exam:  BP (!) 110/58   Pulse 86   Ht 5\' 7"  (1.702 m)   Wt 160 lb (72.6 kg)   LMP 08/31/2019   Breastfeeding No   BMI 25.06 kg/m  CONSTITUTIONAL: Well-developed, well-nourished female in no acute distress.  HENT:  Normocephalic SKIN: Skin is  warm and dry.  MUSCULOSKELETAL: Normal range of motion.  NEUROLOGIC: Alert and oriented to person, place, and time. Normal reflexes, muscle tone coordination.  PSYCHIATRIC: Normal mood and affect. Normal behavior. ABDOMEN: Soft, no distention noted.  No tenderness, rebound or guarding.  PELVIC: Normal appearing external genitalia and urethral meatus; normal appearing vaginal mucosa and cervix. Small amount of dark red blood oozing from os.    Assessment and Plan:     1. Depression affecting pregnancy, antepartum  Patient requesting refill on Zoloft.  - sertraline (ZOLOFT) 100 MG tablet; Take 1 tablet (100 mg total) by mouth daily.  Dispense: 90 tablet;  Refill: 1  2. Breakthrough bleeding with IUD  Rx: Sprintec x 1 month. F/u if no improvement.    Katie Butler, Katie Rutherford, NP [facility] [employer], [facility]

## 2019-09-30 ENCOUNTER — Ambulatory Visit: Payer: Medicaid Other | Admitting: Family Medicine

## 2019-10-12 ENCOUNTER — Other Ambulatory Visit: Payer: Self-pay

## 2019-10-12 DIAGNOSIS — Z975 Presence of (intrauterine) contraceptive device: Secondary | ICD-10-CM

## 2019-10-12 MED ORDER — NORGESTIMATE-ETH ESTRADIOL 0.25-35 MG-MCG PO TABS: 1.0000 | ORAL_TABLET | Freq: Every day | ORAL | 0 refills | Status: DC

## 2019-11-17 ENCOUNTER — Ambulatory Visit: Payer: Medicaid Other | Admitting: Family Medicine

## 2020-09-05 ENCOUNTER — Encounter: Payer: Self-pay | Admitting: Obstetrics & Gynecology

## 2020-09-05 ENCOUNTER — Other Ambulatory Visit (HOSPITAL_COMMUNITY)
Admission: RE | Admit: 2020-09-05 | Discharge: 2020-09-05 | Disposition: A | Discharge status: Home / Self Care | Payer: Medicaid Other | Source: Ambulatory Visit | Attending: Obstetrics & Gynecology | Admitting: Obstetrics & Gynecology

## 2020-09-05 ENCOUNTER — Other Ambulatory Visit: Payer: Self-pay

## 2020-09-05 ENCOUNTER — Ambulatory Visit (INDEPENDENT_AMBULATORY_CARE_PROVIDER_SITE_OTHER): Payer: Medicaid Other | Admitting: Obstetrics & Gynecology

## 2020-09-05 VITALS — BP 118/68 | HR 55 | Ht 67.0 in | Wt 165.0 lb

## 2020-09-05 DIAGNOSIS — Z01419 Encounter for gynecological examination (general) (routine) without abnormal findings: Secondary | ICD-10-CM

## 2020-09-05 DIAGNOSIS — Z975 Presence of (intrauterine) contraceptive device: Secondary | ICD-10-CM | POA: Insufficient documentation

## 2020-09-05 DIAGNOSIS — F339 Major depressive disorder, recurrent, unspecified: Secondary | ICD-10-CM | POA: Diagnosis not present

## 2020-09-05 MED ORDER — SERTRALINE HCL 100 MG PO TABS: 100.0000 mg | ORAL_TABLET | Freq: Every day | ORAL | 5 refills | Status: DC

## 2020-09-05 NOTE — Patient Instructions (Signed)
Preventive Care 21-23 Years Old, Female Preventive care refers to lifestyle choices and visits with your health care provider that can promote health and wellness. This includes: A yearly physical exam. This is also called an annual wellness visit. Regular dental and eye exams. Immunizations. Screening for certain conditions. Healthy lifestyle choices, such as: Eating a healthy diet. Getting regular exercise. Not using drugs or products that contain nicotine and tobacco. Limiting alcohol use. What can I expect for my preventive care visit? Physical exam Your health care provider may check your: Height and weight. These may be used to calculate your BMI (body mass index). BMI is a measurement that tells if you are at a healthy weight. Heart rate and blood pressure. Body temperature. Skin for abnormal spots. Counseling Your health care provider may ask you questions about your: Past medical problems. Family's medical history. Alcohol, tobacco, and drug use. Emotional well-being. Home life and relationship well-being. Sexual activity. Diet, exercise, and sleep habits. Work and work environment. Access to firearms. Method of birth control. Menstrual cycle. Pregnancy history. What immunizations do I need?  Vaccines are usually given at various ages, according to a schedule. Your health care provider will recommend vaccines for you based on your age, medicalhistory, and lifestyle or other factors, such as travel or where you work. What tests do I need?  Blood tests Lipid and cholesterol levels. These may be checked every 5 years starting at age 20. Hepatitis C test. Hepatitis B test. Screening Diabetes screening. This is done by checking your blood sugar (glucose) after you have not eaten for a while (fasting). STD (sexually transmitted disease) testing, if you are at risk. BRCA-related cancer screening. This may be done if you have a family history of breast, ovarian, tubal, or  peritoneal cancers. Pelvic exam and Pap test. This may be done every 3 years starting at age 21. Starting at age 30, this may be done every 5 years if you have a Pap test in combination with an HPV test. Talk with your health care provider about your test results, treatment options,and if necessary, the need for more tests. Follow these instructions at home: Eating and drinking  Eat a healthy diet that includes fresh fruits and vegetables, whole grains, lean protein, and low-fat dairy products. Take vitamin and mineral supplements as recommended by your health care provider. Do not drink alcohol if: Your health care provider tells you not to drink. You are pregnant, may be pregnant, or are planning to become pregnant. If you drink alcohol: Limit how much you have to 0-1 drink a day. Be aware of how much alcohol is in your drink. In the [place] one drink equals one 12 oz bottle of beer (355 mL), one 5 oz glass of wine (148 mL), or one 1 oz glass of hard liquor (44 mL).  Lifestyle Take daily care of your teeth and gums. Brush your teeth every morning and night with fluoride toothpaste. Floss one time each day. Stay active. Exercise for at least 30 minutes 5 or more days each week. Do not use any products that contain nicotine or tobacco, such as cigarettes, e-cigarettes, and chewing tobacco. If you need help quitting, ask your health care provider. Do not use drugs. If you are sexually active, practice safe sex. Use a condom or other form of protection to prevent STIs (sexually transmitted infections). If you do not wish to become pregnant, use a form of birth control. If you plan to become pregnant, see your health care   provider for a prepregnancy visit. Find healthy ways to cope with stress, such as: Meditation, yoga, or listening to music. Journaling. Talking to a trusted person. Spending time with friends and family. Safety Always wear your seat belt while driving or riding in a  vehicle. Do not drive: If you have been drinking alcohol. Do not ride with someone who has been drinking. When you are tired or distracted. While texting. Wear a helmet and other protective equipment during sports activities. If you have firearms in your house, make sure you follow all gun safety procedures. Seek help if you have been physically or sexually abused. What's next? Go to your health care provider once a year for an annual wellness visit. Ask your health care provider how often you should have your eyes and teeth checked. Stay up to date on all vaccines. This information is not intended to replace advice given to you by your health care provider. Make sure you discuss any questions you have with your healthcare provider. Document Revised: 09/18/2019 Document Reviewed: 10/01/2017 Elsevier Patient Education  2022 [employer].

## 2020-09-05 NOTE — Progress Notes (Signed)
GYNECOLOGY ANNUAL PREVENTATIVE CARE ENCOUNTER NOTE  History:     Katie Butler is a 25 y.o. G60P1001 female here for a routine annual gynecologic exam.  Current complaints: none. Desires Zoloft refill.   Denies abnormal vaginal bleeding, discharge, pelvic pain, problems with intercourse or other gynecologic concerns.    Gynecologic History Patient's last menstrual period was 08/27/2020 (exact date). 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     No current facility-administered medications on file prior to visit.    No Known Allergies  Social History:  reports that she has never smoked. She has never used smokeless tobacco. She reports that she does not drink alcohol and does not use drugs.  Family History  Problem Relation Age of Onset   Cancer Other        breast   Arthritis Maternal Grandmother    Fibromyalgia Maternal Grandmother    Scoliosis Maternal Grandmother    Arthritis Maternal Grandfather    Hypertension Maternal Grandfather    Cancer Maternal Grandfather        lung, prostate   Hyperlipidemia Maternal Grandfather    Arthritis Paternal Grandmother    Diabetes Paternal Grandmother    Other Paternal Grandmother    Arthritis Paternal  Grandfather     The following portions of the patient's history were reviewed and updated as appropriate: allergies, current medications, past family history, past medical history, past social history, past surgical history and problem list.  Review of Systems Pertinent items noted in HPI and remainder of comprehensive ROS otherwise negative.  Physical Exam:  BP 118/68   Pulse (!) 55   Ht 5\' 7"  (1.702 m)   Wt 165 lb (74.8 kg)   LMP 08/27/2020 (Exact Date)   BMI 25.84 kg/m  CONSTITUTIONAL: Well-developed, well-nourished female in no acute distress.  HENT:  Normocephalic, atraumatic, External right and left ear normal.  EYES: Conjunctivae and EOM are normal. Pupils are equal, round, and reactive to light. No scleral icterus.  NECK: Normal range of motion, supple, no masses.  Normal thyroid.  SKIN: Skin is warm and dry. No rash noted. Not diaphoretic. No erythema. No pallor. MUSCULOSKELETAL: Normal range of motion. No tenderness.  No cyanosis, clubbing, or edema. NEUROLOGIC: Alert and oriented to person, place, and time. Normal reflexes, muscle tone coordination.  PSYCHIATRIC: Normal mood and affect. Normal behavior. Normal judgment and thought content. CARDIOVASCULAR: Normal heart rate noted, regular rhythm RESPIRATORY: Clear to auscultation bilaterally. Effort and breath sounds normal, no problems with respiration noted. BREASTS: Symmetric in size. No masses, tenderness, skin changes, nipple drainage, or lymphadenopathy bilaterally. Performed in the presence of  a chaperone. ABDOMEN: Soft, no distention noted.  No tenderness, rebound or guarding.  PELVIC: Normal appearing external genitalia and urethral meatus; normal appearing vaginal mucosa and cervix.  Liletta IUD strings noted about 2 cm in length. Scant bloody vaginal discharge noted.  Pap smear obtained.  Normal uterine size, no other palpable masses, no uterine or adnexal tenderness.  Performed in the presence of a chaperone.    Assessment and Plan:     1. Episode of recurrent major depressive disorder, unspecified depression episode severity (HCC) Refill done per her request. - sertraline (ZOLOFT) 100 MG tablet; Take 1 tablet (100 mg total) by mouth daily.  Dispense: 90 tablet; Refill: 5  2. Liletta IUD (intrauterine device) in place Satisfied with method.   3. Well woman exam with routine gynecological exam - Cytology - PAP( [place]), GC/Chlam also checked Will follow up results and manage accordingly. Routine preventative health maintenance measures emphasized. Please refer to After Visit Summary for other counseling recommendations.      Jaynie Collins, MD, FACOG Obstetrician & Gynecologist, [facility], [facility] [employer], [facility]

## 2020-09-06 LAB — CYTOLOGY - PAP
Chlamydia: NEGATIVE
Comment: NEGATIVE
Comment: NORMAL
Diagnosis: NEGATIVE
Neisseria Gonorrhea: NEGATIVE

## 2021-09-05 ENCOUNTER — Encounter: Payer: Self-pay | Admitting: General Practice

## 2021-09-13 ENCOUNTER — Other Ambulatory Visit: Payer: Self-pay | Admitting: Obstetrics & Gynecology

## 2021-09-13 DIAGNOSIS — F339 Major depressive disorder, recurrent, unspecified: Secondary | ICD-10-CM

## 2021-09-26 ENCOUNTER — Other Ambulatory Visit: Payer: Self-pay | Admitting: Obstetrics & Gynecology

## 2021-09-26 DIAGNOSIS — F339 Major depressive disorder, recurrent, unspecified: Secondary | ICD-10-CM

## 2021-10-01 NOTE — Telephone Encounter (Signed)
Patient needs appointment with mental health provider for further refills of her antidepressant.  Jaynie Collins, MD, FACOG Obstetrician & Gynecologist, [facility] for [employer], [facility]

## 2022-03-28 ENCOUNTER — Other Ambulatory Visit: Payer: Self-pay | Admitting: Obstetrics & Gynecology

## 2022-03-28 DIAGNOSIS — F339 Major depressive disorder, recurrent, unspecified: Secondary | ICD-10-CM

## 2023-04-13 ENCOUNTER — Other Ambulatory Visit: Payer: Self-pay | Admitting: Obstetrics & Gynecology

## 2023-04-13 DIAGNOSIS — F339 Major depressive disorder, recurrent, unspecified: Secondary | ICD-10-CM

## 2024-01-04 ENCOUNTER — Ambulatory Visit: Admitting: Obstetrics & Gynecology

## 2024-01-04 VITALS — BP 107/73 | HR 78 | Ht 67.0 in | Wt 169.0 lb

## 2024-01-04 DIAGNOSIS — T8332XA Displacement of intrauterine contraceptive device, initial encounter: Secondary | ICD-10-CM

## 2024-01-04 DIAGNOSIS — Z3202 Encounter for pregnancy test, result negative: Secondary | ICD-10-CM

## 2024-01-04 LAB — POCT URINE PREGNANCY: Preg Test, Ur: NEGATIVE

## 2024-01-04 NOTE — Progress Notes (Unsigned)
    GYNECOLOGY OFFICE PROCEDURE NOTE  [name] is a 28 y.o. G1P1001 here for *** IUD removal. No GYN concerns.  Last pap smear was on *** and was normal.  IUD Removal  Patient identified, informed consent performed, consent signed.   Chaperone present.  Patient was placed in the dorsal lithotomy position, normal external genitalia was noted.  A speculum was placed in the patient's vagina, normal discharge was noted, no lesions. The cervix was visualized, no lesions, no abnormal discharge.  The strings of the IUD were grasped and pulled using ring forceps. The IUD was removed in its entirety. *** (FOR LOST STRINGS:  Patient was told that this will be done under ultrasound guidance, paracervical block will also be administered and she agreed to this. The strings of the IUD were not visualized. The cervix was swabbed with betadine. 10 ml of 2% lidocaine  with epinephrine was used a paracervical block.  The cervical os was dilated with plastic disposable dilators.  Using bedside ultrasound guidance,  Kelly forceps were introduced into the endometrial cavity and the IUD was grasped after a couple of attempts and removed in its entirety).  Patient tolerated the procedure well.    Patient will use *** for contraception/***plans for pregnancy soon and she was told to avoid teratogens, take PNV and folic acid.  Routine preventative health maintenance measures emphasized.   [name], MD, FACOG Obstetrician & Gynecologist, [facility] for [employer], [facility]
